# Patient Record
Sex: Male | Born: 1960 | Race: Black or African American | Hispanic: No | Marital: Married | Smoking: Never smoker
Health system: Southern US, Community
[De-identification: ages and names within clinical notes are randomized; demographics above are authoritative.]

## PROBLEM LIST (undated history)

## (undated) DIAGNOSIS — E119 Type 2 diabetes mellitus without complications: Secondary | ICD-10-CM

## (undated) DIAGNOSIS — C61 Malignant neoplasm of prostate: Secondary | ICD-10-CM

## (undated) HISTORY — DX: Type 2 diabetes mellitus without complications: E11.9

## (undated) MED FILL — Pegfilgrastim-fpgk Soln Prefilled Syringe 6 MG/0.6ML: SUBCUTANEOUS | Qty: 0.6 | Status: AC

---

## 1998-03-29 ENCOUNTER — Emergency Department (HOSPITAL_COMMUNITY): Admission: EM | Admit: 1998-03-29 | Discharge: 1998-03-29 | Payer: Self-pay | Admitting: Emergency Medicine

## 2006-04-14 ENCOUNTER — Emergency Department (HOSPITAL_COMMUNITY): Admission: EM | Admit: 2006-04-14 | Discharge: 2006-04-14 | Payer: Self-pay | Admitting: Emergency Medicine

## 2006-04-29 ENCOUNTER — Emergency Department (HOSPITAL_COMMUNITY): Admission: EM | Admit: 2006-04-29 | Discharge: 2006-04-29 | Payer: Self-pay | Admitting: Emergency Medicine

## 2008-12-11 ENCOUNTER — Emergency Department (HOSPITAL_COMMUNITY): Admission: EM | Admit: 2008-12-11 | Discharge: 2008-12-11 | Payer: Self-pay | Admitting: Family Medicine
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
## 2015-10-11 ENCOUNTER — Ambulatory Visit: Payer: Self-pay | Admitting: Podiatry

## 2016-08-11 ENCOUNTER — Encounter: Payer: Self-pay | Admitting: Physician Assistant

## 2016-08-11 ENCOUNTER — Encounter (HOSPITAL_COMMUNITY): Payer: Self-pay

## 2016-08-11 ENCOUNTER — Emergency Department (HOSPITAL_COMMUNITY)
Admission: EM | Admit: 2016-08-11 | Discharge: 2016-08-11 | Disposition: A | Discharge status: Home / Self Care | Payer: Self-pay | Attending: Emergency Medicine | Admitting: Emergency Medicine

## 2016-08-11 ENCOUNTER — Ambulatory Visit (INDEPENDENT_AMBULATORY_CARE_PROVIDER_SITE_OTHER): Payer: Self-pay | Admitting: Physician Assistant

## 2016-08-11 VITALS — BP 148/78 | HR 58 | Temp 97.6°F | Resp 58 | Ht 73.62 in | Wt 195.0 lb

## 2016-08-11 DIAGNOSIS — E119 Type 2 diabetes mellitus without complications: Secondary | ICD-10-CM

## 2016-08-11 DIAGNOSIS — E860 Dehydration: Secondary | ICD-10-CM | POA: Insufficient documentation

## 2016-08-11 DIAGNOSIS — R7989 Other specified abnormal findings of blood chemistry: Secondary | ICD-10-CM

## 2016-08-11 DIAGNOSIS — R81 Glycosuria: Secondary | ICD-10-CM

## 2016-08-11 DIAGNOSIS — E1165 Type 2 diabetes mellitus with hyperglycemia: Secondary | ICD-10-CM | POA: Insufficient documentation

## 2016-08-11 DIAGNOSIS — Z7984 Long term (current) use of oral hypoglycemic drugs: Secondary | ICD-10-CM | POA: Insufficient documentation

## 2016-08-11 DIAGNOSIS — R350 Frequency of micturition: Secondary | ICD-10-CM

## 2016-08-11 DIAGNOSIS — R739 Hyperglycemia, unspecified: Secondary | ICD-10-CM

## 2016-08-11 DIAGNOSIS — R634 Abnormal weight loss: Secondary | ICD-10-CM

## 2016-08-11 LAB — URINALYSIS, ROUTINE W REFLEX MICROSCOPIC
Bacteria, UA: NONE SEEN
Bilirubin Urine: NEGATIVE
HGB URINE DIPSTICK: NEGATIVE
KETONES UR: 5 mg/dL — AB
LEUKOCYTES UA: NEGATIVE
Nitrite: NEGATIVE
PROTEIN: NEGATIVE mg/dL
SQUAMOUS EPITHELIAL / LPF: NONE SEEN
Specific Gravity, Urine: 1.031 — ABNORMAL HIGH (ref 1.005–1.030)
pH: 5 (ref 5.0–8.0)

## 2016-08-11 LAB — POCT URINALYSIS DIP (MANUAL ENTRY)
BILIRUBIN UA: NEGATIVE
Blood, UA: NEGATIVE
LEUKOCYTES UA: NEGATIVE
NITRITE UA: NEGATIVE
Protein Ur, POC: NEGATIVE mg/dL
Spec Grav, UA: <=1.005 — AB (ref 1.010–1.025)
Urobilinogen, UA: 0.2 E.U./dL
pH, UA: 5 (ref 5.0–8.0)

## 2016-08-11 LAB — POCT CBC
Granulocyte percent: 60.5 %G (ref 37–80)
HEMATOCRIT: 43.1 % — AB (ref 43.5–53.7)
HEMOGLOBIN: 14.7 g/dL (ref 14.1–18.1)
LYMPH, POC: 1.5 (ref 0.6–3.4)
MCH, POC: 29.3 pg (ref 27–31.2)
MCHC: 34 g/dL (ref 31.8–35.4)
MCV: 86.1 fL (ref 80–97)
MID (CBC): 0.1 (ref 0–0.9)
MPV: 9.4 fL (ref 0–99.8)
POC GRANULOCYTE: 2.5 (ref 2–6.9)
POC LYMPH %: 36.8 % (ref 10–50)
POC MID %: 2.7 % (ref 0–12)
Platelet Count, POC: 163 10*3/uL (ref 142–424)
RBC: 5.01 M/uL (ref 4.69–6.13)
RDW, POC: 12.9 %
WBC: 4.1 10*3/uL — AB (ref 4.6–10.2)

## 2016-08-11 LAB — HEPATIC FUNCTION PANEL
ALT: 61 U/L (ref 17–63)
AST: 39 U/L (ref 15–41)
Albumin: 4 g/dL (ref 3.5–5.0)
Alkaline Phosphatase: 98 U/L (ref 38–126)
BILIRUBIN DIRECT: 0.1 mg/dL (ref 0.1–0.5)
BILIRUBIN TOTAL: 1 mg/dL (ref 0.3–1.2)
Indirect Bilirubin: 0.9 mg/dL (ref 0.3–0.9)
Total Protein: 7.4 g/dL (ref 6.5–8.1)

## 2016-08-11 LAB — POC MICROSCOPIC URINALYSIS (UMFC): MUCUS RE: ABSENT

## 2016-08-11 LAB — BASIC METABOLIC PANEL
ANION GAP: 12 (ref 5–15)
BUN: 22 mg/dL — ABNORMAL HIGH (ref 6–20)
CHLORIDE: 96 mmol/L — AB (ref 101–111)
CO2: 22 mmol/L (ref 22–32)
CREATININE: 1.3 mg/dL — AB (ref 0.61–1.24)
Calcium: 9.5 mg/dL (ref 8.9–10.3)
GFR calc non Af Amer: >60 mL/min (ref >60)
Glucose, Bld: 609 mg/dL (ref 65–99)
POTASSIUM: 4.3 mmol/L (ref 3.5–5.1)
SODIUM: 130 mmol/L — AB (ref 135–145)

## 2016-08-11 LAB — CBC
HCT: 42.5 % (ref 39.0–52.0)
HEMOGLOBIN: 14.8 g/dL (ref 13.0–17.0)
MCH: 29 pg (ref 26.0–34.0)
MCHC: 34.8 g/dL (ref 30.0–36.0)
MCV: 83.2 fL (ref 78.0–100.0)
PLATELETS: 188 10*3/uL (ref 150–400)
RBC: 5.11 MIL/uL (ref 4.22–5.81)
RDW: 12.5 % (ref 11.5–15.5)
WBC: 4.9 10*3/uL (ref 4.0–10.5)

## 2016-08-11 LAB — GLUCOSE, POCT (MANUAL RESULT ENTRY)

## 2016-08-11 LAB — CBG MONITORING, ED
GLUCOSE-CAPILLARY: 296 mg/dL — AB (ref 65–99)
Glucose-Capillary: 490 mg/dL — ABNORMAL HIGH (ref 65–99)

## 2016-08-11 LAB — POCT GLYCOSYLATED HEMOGLOBIN (HGB A1C): Hemoglobin A1C: 13.9

## 2016-08-11 MED ORDER — SODIUM CHLORIDE 0.9 % IV BOLUS (SEPSIS)
1000.0000 mL | Freq: Once | INTRAVENOUS | Status: AC
  Administered 2016-08-11: 1000 mL via INTRAVENOUS

## 2016-08-11 MED ORDER — METFORMIN HCL 500 MG PO TABS: 500.0000 mg | ORAL_TABLET | Freq: Two times a day (BID) | ORAL | 0 refills | Status: DC

## 2016-08-11 MED ORDER — BLOOD GLUCOSE MONITOR KIT: PACK | 0 refills | Status: DC

## 2016-08-11 MED ORDER — INSULIN ASPART 100 UNIT/ML ~~LOC~~ SOLN
10.0000 [IU] | Freq: Once | SUBCUTANEOUS | Status: AC
  Administered 2016-08-11: 10 [IU] via INTRAVENOUS
  Filled 2016-08-11: qty 1

## 2016-08-11 NOTE — Patient Instructions (Addendum)
Due to the fact that we cannot get a glucose reading on you and your concerning symptoms, you need to go directly to the ED for evaluation. You can go to either [facility] or [facility]. Our providers here are more than willing to take care of your diabetes management once you have been safely evaluated and treated at the ED. Thank you for letting me participate in your health and well being.    IF you received an x-ray today, you will receive an invoice from [facility]. Please contact [facility] at [phone] with questions or concerns regarding your invoice.   IF you received labwork today, you will receive an invoice from [facility]. Please contact [facility] at [phone] with questions or concerns regarding your invoice.   Our billing staff will not be able to assist you with questions regarding bills from these companies.  You will be contacted with the lab results as soon as they are available. The fastest way to get your results is to activate your My Chart account. Instructions are located on the last page of this paperwork. If you have not heard from us regarding the results in 2 weeks, please contact this office.

## 2016-08-11 NOTE — Progress Notes (Signed)
MRN: [id number]   Subjective:   Brandon [name] is a 56 y.o. male who presents for follow up on from a [facility] occupational health visit from earlier today. He went there for a work physical and he told the physician he was having weight loss, frequent urination, blurry vision, polydipsia, dry mouth,  x 3 weeks. They checked his sugar and told him it was high (he cannot remember the value) but he should come here for evaluation.   Pt has also had some  paresthesia of the feet x years. In terms of weight loss, he has lost 18 lbs in the past 2-3 weeks. Patient denies foot ulcerations, increased appetite, nausea and vomiting.    Diet consists of protein, carbs, fruits, vegetables and water. Takes 3 scoops of mass protein a day x years. They recently took it off the shelf but he does not know why. He does not know how much carbs/sugars each contains. He does not eat sweets. Exercise includes aerobics and weightlifting 4 times a week at the gym. Denies smoking or alcohol use.   He has no PMH. The last time he had blood work prior to this morning was years ago.  Has FH of T2DM in mother.    Objective:   PHYSICAL EXAM BP (!) 148/78   Pulse (!) 58   Temp 97.6 F (36.4 C) (Oral)   Resp (!) 58   Ht 6' 1.62" (1.87 m)   Wt 195 lb (88.5 kg)   SpO2 98%   BMI 25.29 kg/m   Physical Exam  Constitutional: He is oriented to person, place, and time. He appears well-developed and well-nourished. No distress.  HENT:  Head: Normocephalic and atraumatic.  Eyes: Conjunctivae are normal.  Neck: Normal range of motion.  Cardiovascular: Normal rate, regular rhythm, normal heart sounds and intact distal pulses.   Pulmonary/Chest: Effort normal and breath sounds normal.  Abdominal: Soft. Bowel sounds are normal. There is no tenderness.  Neurological: He is alert and oriented to person, place, and time.  Skin: Skin is warm and dry.  Psychiatric: He has a normal mood and affect.  Vitals  reviewed.   Results for orders placed or performed in visit on 08/11/16 (from the past 24 hour(s))  POCT urinalysis dipstick     Status: Abnormal   Collection Time: 08/11/16 11:52 AM  Result Value Ref Range   Color, UA yellow yellow   Clarity, UA clear clear   Glucose, UA =500 (A) negative mg/dL   Bilirubin, UA negative negative   Ketones, POC UA trace (5) (A) negative mg/dL   Spec Grav, UA <=4.098 (A) 1.010 - 1.025   Blood, UA negative negative   pH, UA 5.0 5.0 - 8.0   Protein Ur, POC negative negative mg/dL   Urobilinogen, UA 0.2 0.2 or 1.0 E.U./dL   Nitrite, UA Negative Negative   Leukocytes, UA Negative Negative  POCT CBC     Status: Abnormal   Collection Time: 08/11/16 11:57 AM  Result Value Ref Range   WBC 4.1 (A) 4.6 - 10.2 K/uL   Lymph, poc 1.5 0.6 - 3.4   POC LYMPH PERCENT 36.8 10 - 50 %L   MID (cbc) 0.1 0 - 0.9   POC MID % 2.7 0 - 12 %M   POC Granulocyte 2.5 2 - 6.9   Granulocyte percent 60.5 37 - 80 %G   RBC 5.01 4.69 - 6.13 M/uL   Hemoglobin 14.7 14.1 - 18.1 g/dL   HCT, POC 11.9 (  A) 43.5 - 53.7 %   MCV 86.1 80 - 97 fL   MCH, POC 29.3 27 - 31.2 pg   MCHC 34.0 31.8 - 35.4 g/dL   RDW, POC 82.912.9 %   Platelet Count, POC 163 142 - 424 K/uL   MPV 9.4 0 - 99.8 fL  POCT glucose (manual entry)     Status: None   Collection Time: 08/11/16 11:58 AM  Result Value Ref Range   POC Glucose HHH 70 - 99 mg/dl  POCT Microscopic Urinalysis ([facility])     Status: Abnormal   Collection Time: 08/11/16 12:03 PM  Result Value Ref Range   WBC,UR,HPF,POC None None WBC/hpf   RBC,UR,HPF,POC None None RBC/hpf   Bacteria None None, Too numerous to count   Mucus Absent Absent   Epithelial Cells, UR Per Microscopy Few (A) None, Too numerous to count cells/hpf  POCT glycosylated hemoglobin (Hb A1C)     Status: None   Collection Time: 08/11/16 12:03 PM  Result Value Ref Range   Hemoglobin A1C 13.9     Assessment and Plan :  1. Frequent urination - POCT urinalysis dipstick - POCT  Microscopic Urinalysis ([facility]) - POCT CBC - POCT glycosylated hemoglobin (Hb A1C) - POCT glucose (manual entry) 2. Loss of weight 3. Hyperglycemia 4. Glucosuria 5. Newly diagnosed diabetes (HCC) Due to pt's symptoms, glucosuria, ketonuria, and elevated glucose of unknown value in our clinic, pt warrants further emergent evaluation and treatment by the ED. Pt is stable and does therefore not warrant EMS transportation. He agrees to [facility]. His plan is to follow up here for management of T2DM>

## 2016-08-11 NOTE — ED Provider Notes (Signed)
[facility] DEPT Provider Note   CSN: [id number] Arrival date & time: 08/11/16  1239     History   Chief Complaint Chief Complaint  Patient presents with  . Hyperglycemia    HPI Brandon [name] is a 56 y.o. male.  Brandon [name] is a 56 y.o. Male who presents to the emergency department from his primary care doctor's office with concern for new onset diabetes. Patient reports over the past 3 weeks patient has had increased thirst and urination. He also reports intermittent episodes of blurry vision and tingling in his extremities. He denies these complaints currently. He reports being seen by his primary care doctor today who believes he has new onset diabetes. He was sent to the emergency department. They were unable to obtain a blood sugar on her glucometer. Patient reports a strong family history of diabetes. He tells me he is very active and this is upsetting that he has diabetes. He denies fevers, recent illness, dysuria, lightheadedness, dizziness, syncope, abdominal pain, nausea, vomiting, diarrhea, or rashes.   The history is provided by the patient and medical records. No language interpreter was used.  Hyperglycemia  Associated symptoms: increased thirst and polyuria   Associated symptoms: no abdominal pain, no chest pain, no dysuria, no fever, no nausea, no shortness of breath, no vomiting and no weakness     History reviewed. No pertinent past medical history.  There are no active problems to display for this patient.   History reviewed. No pertinent surgical history.     Home Medications    Prior to Admission medications   Medication Sig Start Date End Date Taking? Authorizing Provider  blood glucose meter kit and supplies KIT Dispense based on patient and insurance preference. Use up to four times daily as directed. (FOR ICD-9 250.00, 250.01). 08/11/16   Waynetta Pean, PA-C  metFORMIN (GLUCOPHAGE) 500 MG tablet Take 1 tablet (500 mg total) by mouth 2 (two)  times daily. 08/11/16   Waynetta Pean, PA-C    Family History Family History  Problem Relation Age of Onset  . Diabetes Mother   . Diabetes Maternal Grandmother     Social History Social History  Substance Use Topics  . Smoking status: Never Smoker  . Smokeless tobacco: Never Used  . Alcohol use No     Allergies   Patient has no known allergies.   Review of Systems Review of Systems  Constitutional: Negative for chills and fever.  HENT: Negative for congestion and sore throat.   Eyes: Positive for visual disturbance (intermittent ).  Respiratory: Negative for cough and shortness of breath.   Cardiovascular: Negative for chest pain.  Gastrointestinal: Negative for abdominal pain, diarrhea, nausea and vomiting.  Endocrine: Positive for polydipsia and polyuria.  Genitourinary: Positive for frequency. Negative for difficulty urinating, dysuria and urgency.  Musculoskeletal: Negative for back pain and neck pain.  Skin: Negative for rash.  Neurological: Negative for syncope, weakness, light-headedness and headaches.     Physical Exam Updated Vital Signs BP 113/84   Pulse 61   Temp 97.2 F (36.2 C) (Oral)   Resp 16   SpO2 100%   Physical Exam  Constitutional: He is oriented to person, place, and time. He appears well-developed and well-nourished. No distress.  Nontoxic appearing.  HENT:  Head: Normocephalic and atraumatic.  Mouth/Throat: Oropharynx is clear and moist.  Mucous membranes are moist.  Eyes: Conjunctivae are normal. Pupils are equal, round, and reactive to light. Right eye exhibits no discharge. Left eye exhibits no discharge.  Neck: Normal range of motion. Neck supple. No JVD present.  Cardiovascular: Normal rate, regular rhythm, normal heart sounds and intact distal pulses.  Exam reveals no gallop and no friction rub.   No murmur heard. Pulmonary/Chest: Effort normal and breath sounds normal. No stridor. No respiratory distress. He has no wheezes. He  has no rales.  Abdominal: Soft. There is no tenderness. There is no guarding.  Abdomen is soft and nontender to palpation.  Musculoskeletal: He exhibits no edema or tenderness.  Lymphadenopathy:    He has no cervical adenopathy.  Neurological: He is alert and oriented to person, place, and time. No sensory deficit. Coordination normal.  Skin: Skin is warm and dry. Capillary refill takes less than 2 seconds. No rash noted. He is not diaphoretic. No erythema. No pallor.  Psychiatric: He has a normal mood and affect. His behavior is normal.  Nursing note and vitals reviewed.    ED Treatments / Results  Labs (all labs ordered are listed, but only abnormal results are displayed) Labs Reviewed  BASIC METABOLIC PANEL - Abnormal; Notable for the following:       Result Value   Sodium 130 (*)    Chloride 96 (*)    Glucose, Bld 609 (*)    BUN 22 (*)    Creatinine, Ser 1.30 (*)    All other components within normal limits  URINALYSIS, ROUTINE W REFLEX MICROSCOPIC - Abnormal; Notable for the following:    Color, Urine STRAW (*)    Specific Gravity, Urine 1.031 (*)    Glucose, UA >=500 (*)    Ketones, ur 5 (*)    All other components within normal limits  CBG MONITORING, ED - Abnormal; Notable for the following:    Glucose-Capillary 490 (*)    All other components within normal limits  CBG MONITORING, ED - Abnormal; Notable for the following:    Glucose-Capillary 296 (*)    All other components within normal limits  CBC  HEPATIC FUNCTION PANEL    EKG  EKG Interpretation None       Radiology No results found.  Procedures Procedures (including critical care time)  Medications Ordered in ED Medications  sodium chloride 0.9 % bolus 1,000 mL (0 mLs Intravenous Stopped 08/11/16 1456)  insulin aspart (novoLOG) injection 10 Units (10 Units Intravenous Given 08/11/16 1455)  sodium chloride 0.9 % bolus 1,000 mL (0 mLs Intravenous Stopped 08/11/16 1606)     Initial Impression /  Assessment and Plan / ED Course  I have reviewed the triage vital signs and the nursing notes.  Pertinent labs & imaging results that were available during my care of the patient were reviewed by me and considered in my medical decision making (see chart for details).    This  is a 56 y.o. Male who presents to the emergency department from his primary care doctor's office with concern for new onset diabetes. Patient reports over the past 3 weeks patient has had increased thirst and urination. He also reports intermittent episodes of blurry vision and tingling in his extremities. He denies these complaints currently. He reports being seen by his primary care doctor today who believes he has new onset diabetes. He was sent to the emergency department. They were unable to obtain a blood sugar on her glucometer. Patient reports a strong family history of diabetes. On examination is afebrile nontoxic appearing. Blood sugar here shows a glucose of 609. Creatinine is elevated at 1.30. No baseline to compare. We'll have him have  this rechecked by primary care. He has a normal anion gap. Urinalysis shows glucosuria.  Patient received a 2 L fluid bolus and 10 units of insulin. At recheck patient's blood sugars is 296. Will discharge at this time and start the patient on metformin 500 mg twice a day. We'll have him closely follow-up with his primary care provider next week. I discussed the expected side effects with metformin. I also provided him with a glucometer prescription and discussed checking his blood sugars. I discussed return precautions. I advised the patient to follow-up with their primary care provider this week. I advised the patient to return to the emergency department with new or worsening symptoms or new concerns. The patient verbalized understanding and agreement with plan.    This patient was discussed with Dr. Lita Mains who agrees with assessment and plan.   Final Clinical Impressions(s) / ED  Diagnoses   Final diagnoses:  New onset type 2 diabetes mellitus (HCC)  Hyperglycemia  Dehydration  Elevated serum creatinine    New Prescriptions New Prescriptions   BLOOD GLUCOSE METER KIT AND SUPPLIES KIT    Dispense based on patient and insurance preference. Use up to four times daily as directed. (FOR ICD-9 250.00, 250.01).   METFORMIN (GLUCOPHAGE) 500 MG TABLET    Take 1 tablet (500 mg total) by mouth 2 (two) times daily.     Waynetta Pean, PA-C 08/11/16 1620    Julianne Rice, MD 08/18/16 [phone]

## 2016-08-11 NOTE — ED Notes (Signed)
Pt ambulated to room. Giving urine sample at this time.

## 2016-08-11 NOTE — ED Triage Notes (Signed)
Pt states he was at his PCP office and was sent here for possible DM. He had CBG that read high and has had increased thirst and urination X3 weeks.

## 2016-08-12 LAB — CMP14+EGFR
A/G RATIO: 1.6 (ref 1.2–2.2)
ALT: 57 IU/L — ABNORMAL HIGH (ref 0–44)
AST: 37 IU/L (ref 0–40)
Albumin: 4.2 g/dL (ref 3.5–5.5)
Alkaline Phosphatase: 108 IU/L (ref 39–117)
BUN/Creatinine Ratio: 17 (ref 9–20)
BUN: 22 mg/dL (ref 6–24)
Bilirubin Total: 0.6 mg/dL (ref 0.0–1.2)
CALCIUM: 9.2 mg/dL (ref 8.7–10.2)
CO2: 22 mmol/L (ref 18–29)
Chloride: 91 mmol/L — ABNORMAL LOW (ref 96–106)
Creatinine, Ser: 1.33 mg/dL — ABNORMAL HIGH (ref 0.76–1.27)
GFR, EST AFRICAN AMERICAN: 70 mL/min/{1.73_m2} (ref >59)
GFR, EST NON AFRICAN AMERICAN: 60 mL/min/{1.73_m2} (ref >59)
GLOBULIN, TOTAL: 2.7 g/dL (ref 1.5–4.5)
GLUCOSE: 681 mg/dL — AB (ref 65–99)
POTASSIUM: 5 mmol/L (ref 3.5–5.2)
SODIUM: 129 mmol/L — AB (ref 134–144)
Total Protein: 6.9 g/dL (ref 6.0–8.5)

## 2016-08-12 LAB — TSH: TSH: 1.28 u[IU]/mL (ref 0.450–4.500)

## 2016-08-14 ENCOUNTER — Telehealth: Payer: Self-pay | Admitting: Physician Assistant

## 2016-08-14 NOTE — Telephone Encounter (Signed)
Pt is [facility]  Best number [phone]

## 2016-08-14 NOTE — Telephone Encounter (Signed)
See very abnormal results and advise

## 2016-08-14 NOTE — Telephone Encounter (Signed)
Called pt, he was not home but his wife answered. I discussed that all labs we ordered for that day were discussed and printed on his AVS. He will need to follow up for chronic manage of diabetes.

## 2016-08-17 ENCOUNTER — Ambulatory Visit: Payer: Self-pay | Admitting: Physician Assistant

## 2016-08-22 ENCOUNTER — Emergency Department (HOSPITAL_COMMUNITY)
Admission: EM | Admit: 2016-08-22 | Discharge: 2016-08-22 | Disposition: A | Discharge status: Home / Self Care | Payer: Self-pay | Attending: Emergency Medicine | Admitting: Emergency Medicine

## 2016-08-22 ENCOUNTER — Ambulatory Visit (INDEPENDENT_AMBULATORY_CARE_PROVIDER_SITE_OTHER): Payer: Self-pay | Admitting: Physician Assistant

## 2016-08-22 ENCOUNTER — Encounter: Payer: Self-pay | Admitting: Physician Assistant

## 2016-08-22 ENCOUNTER — Encounter (HOSPITAL_COMMUNITY): Payer: Self-pay

## 2016-08-22 VITALS — BP 143/81 | HR 74 | Temp 98.1°F | Resp 18 | Ht 73.43 in | Wt 190.6 lb

## 2016-08-22 DIAGNOSIS — R739 Hyperglycemia, unspecified: Secondary | ICD-10-CM

## 2016-08-22 DIAGNOSIS — E119 Type 2 diabetes mellitus without complications: Secondary | ICD-10-CM

## 2016-08-22 DIAGNOSIS — E1165 Type 2 diabetes mellitus with hyperglycemia: Secondary | ICD-10-CM | POA: Insufficient documentation

## 2016-08-22 DIAGNOSIS — R824 Acetonuria: Secondary | ICD-10-CM

## 2016-08-22 DIAGNOSIS — Z7984 Long term (current) use of oral hypoglycemic drugs: Secondary | ICD-10-CM | POA: Insufficient documentation

## 2016-08-22 DIAGNOSIS — Z79899 Other long term (current) drug therapy: Secondary | ICD-10-CM | POA: Insufficient documentation

## 2016-08-22 DIAGNOSIS — R81 Glycosuria: Secondary | ICD-10-CM

## 2016-08-22 LAB — AMYLASE: AMYLASE: 35 U/L (ref 28–100)

## 2016-08-22 LAB — CBG MONITORING, ED
GLUCOSE-CAPILLARY: 229 mg/dL — AB (ref 65–99)
Glucose-Capillary: 454 mg/dL — ABNORMAL HIGH (ref 65–99)

## 2016-08-22 LAB — GLUCOSE, POCT (MANUAL RESULT ENTRY)

## 2016-08-22 LAB — URINALYSIS, ROUTINE W REFLEX MICROSCOPIC
BACTERIA UA: NONE SEEN
BILIRUBIN URINE: NEGATIVE
Glucose, UA: >=500 mg/dL — AB
HGB URINE DIPSTICK: NEGATIVE
KETONES UR: 20 mg/dL — AB
LEUKOCYTES UA: NEGATIVE
NITRITE: NEGATIVE
PH: 5 (ref 5.0–8.0)
Protein, ur: NEGATIVE mg/dL
Specific Gravity, Urine: 1.035 — ABNORMAL HIGH (ref 1.005–1.030)
Squamous Epithelial / LPF: NONE SEEN

## 2016-08-22 LAB — HEPATIC FUNCTION PANEL
ALBUMIN: 4 g/dL (ref 3.5–5.0)
ALT: 59 U/L (ref 17–63)
AST: 30 U/L (ref 15–41)
Alkaline Phosphatase: 77 U/L (ref 38–126)
BILIRUBIN TOTAL: 0.7 mg/dL (ref 0.3–1.2)
Bilirubin, Direct: 0.1 mg/dL (ref 0.1–0.5)
Indirect Bilirubin: 0.6 mg/dL (ref 0.3–0.9)
TOTAL PROTEIN: 6.9 g/dL (ref 6.5–8.1)

## 2016-08-22 LAB — CBC
HEMATOCRIT: 40.9 % (ref 39.0–52.0)
HEMOGLOBIN: 14 g/dL (ref 13.0–17.0)
MCH: 28.3 pg (ref 26.0–34.0)
MCHC: 34.2 g/dL (ref 30.0–36.0)
MCV: 82.8 fL (ref 78.0–100.0)
Platelets: 188 10*3/uL (ref 150–400)
RBC: 4.94 MIL/uL (ref 4.22–5.81)
RDW: 12.7 % (ref 11.5–15.5)
WBC: 4.5 10*3/uL (ref 4.0–10.5)

## 2016-08-22 LAB — BASIC METABOLIC PANEL
ANION GAP: 9 (ref 5–15)
BUN: 20 mg/dL (ref 6–20)
CO2: 23 mmol/L (ref 22–32)
Calcium: 9.4 mg/dL (ref 8.9–10.3)
Chloride: 101 mmol/L (ref 101–111)
Creatinine, Ser: 1.07 mg/dL (ref 0.61–1.24)
GFR calc Af Amer: >60 mL/min (ref >60)
Glucose, Bld: 451 mg/dL — ABNORMAL HIGH (ref 65–99)
POTASSIUM: 4.3 mmol/L (ref 3.5–5.1)
SODIUM: 133 mmol/L — AB (ref 135–145)

## 2016-08-22 LAB — POCT URINALYSIS DIP (MANUAL ENTRY)
Bilirubin, UA: NEGATIVE
Blood, UA: NEGATIVE
Glucose, UA: 500 mg/dL — AB
LEUKOCYTES UA: NEGATIVE
Nitrite, UA: NEGATIVE
PROTEIN UA: NEGATIVE mg/dL
Spec Grav, UA: <=1.005 — AB (ref 1.010–1.025)
UROBILINOGEN UA: 0.2 U/dL
pH, UA: 5.5 (ref 5.0–8.0)

## 2016-08-22 LAB — LIPASE, BLOOD: Lipase: 21 U/L (ref 11–51)

## 2016-08-22 MED ORDER — SODIUM CHLORIDE 0.9 % IV BOLUS (SEPSIS)
1000.0000 mL | Freq: Once | INTRAVENOUS | Status: AC
  Administered 2016-08-22: 1000 mL via INTRAVENOUS

## 2016-08-22 NOTE — ED Provider Notes (Signed)
Patient signed out at end of shift by Brandon RutherfordShawn Joy, PA-C. New diabetic, diagnosed last week. On BID metformin. Saw his PCP for first time today and CBG found to be elevated so sent here for treatment. Here, hyperglycemia 450. No other complaints. Getting fluids. Will need recheck CBG. Anticipate discharge home.  Recheck CBG after fluids is 227. He is stable for discharge home. He has a follow up appointment with his doctor in the morning.    Elpidio AnisUpstill, Lexee Brashears, PA-C 08/22/16 2146    Tilden Fossaees, Elizabeth, MD 08/23/16 [phone]

## 2016-08-22 NOTE — Patient Instructions (Addendum)
You need to go immediately to the ED for further evaluation.  I need to see you the day after you are discharged so we can start you on the appropriate dose of medication.   IF you received an x-ray today, you will receive an invoice from [facility]. Please contact [facility] at [phone] with questions or concerns regarding your invoice.   IF you received labwork today, you will receive an invoice from [facility]. Please contact [facility] at [phone] with questions or concerns regarding your invoice.   Our billing staff will not be able to assist you with questions regarding bills from these companies.  You will be contacted with the lab results as soon as they are available. The fastest way to get your results is to activate your My Chart account. Instructions are located on the last page of this paperwork. If you have not heard from us regarding the results in 2 weeks, please contact this office.

## 2016-08-22 NOTE — ED Provider Notes (Signed)
[facility] DEPT Provider Note   CSN: [id number] Arrival date & time: 08/22/16  1331     History   Chief Complaint Chief Complaint  Patient presents with  . Hyperglycemia    HPI Brandon [name] is a 56 y.o. male.  HPI   Brandon [name] is a 56 y.o. male, with a history of possible DM, presenting to the ED with hyperglycemia.   Patient states he was seen on May 11 for hyperglycemia and diagnosed with new onset diabetes. Started on Metformin and told to follow up a PCP.  States he established care with [facility] today, saw [place], PA-C, found to have a glucose of over 500. Patient states, "I was told to come to the ED to find out if I need insulin and was told they would see me again tomorrow morning." Endorses polyuria and polydipsia. Also endorses unintended weight loss of 24lbs over the last month.  Denies SOB, CP, abdominal pain, N/V/D, fever/chills, or any other complaints.   Patient is not obese, eats well, and works out everyday. He does have an extensive family history of diabetes, however.    Past Medical History:  Diagnosis Date  . Diabetes mellitus without complication ([place])     There are no active problems to display for this patient.   History reviewed. No pertinent surgical history.     Home Medications    Prior to Admission medications   Medication Sig Start Date End Date Taking? Authorizing Provider  blood glucose meter kit and supplies KIT Dispense based on patient and insurance preference. Use up to four times daily as directed. (FOR ICD-9 250.00, 250.01). 08/11/16  Yes Waynetta Pean, PA-C  metFORMIN (GLUCOPHAGE) 500 MG tablet Take 1 tablet (500 mg total) by mouth 2 (two) times daily. 08/11/16  Yes Waynetta Pean, PA-C  Multiple Vitamins-Minerals (MULTIVITAMIN WITH MINERALS) tablet Take 1 tablet by mouth daily.   Yes [provider]    Family History Family History  Problem Relation Age of Onset  . Diabetes Mother    . Diabetes Maternal Grandmother     Social History Social History  Substance Use Topics  . Smoking status: Never Smoker  . Smokeless tobacco: Never Used  . Alcohol use No     Allergies   Patient has no known allergies.   Review of Systems Review of Systems  Constitutional: Positive for unexpected weight change. Negative for chills, diaphoresis and fever.  Respiratory: Negative for shortness of breath.   Cardiovascular: Negative for chest pain.  Gastrointestinal: Negative for abdominal pain, nausea and vomiting.  Endocrine: Positive for polydipsia and polyuria.  All other systems reviewed and are negative.    Physical Exam Updated Vital Signs BP 124/83 (BP Location: Right Arm)   Pulse 67   Temp 98.1 F (36.7 C) (Oral)   Resp 16   SpO2 99%   Physical Exam  Constitutional: He appears well-developed and well-nourished. No distress.  HENT:  Head: Normocephalic and atraumatic.  Eyes: Conjunctivae are normal.  Neck: Neck supple.  Cardiovascular: Normal rate, regular rhythm, normal heart sounds and intact distal pulses.   Pulmonary/Chest: Effort normal and breath sounds normal. No respiratory distress.  Abdominal: Soft. There is no tenderness. There is no guarding.  Musculoskeletal: He exhibits no edema.  Lymphadenopathy:    He has no cervical adenopathy.  Neurological: He is alert.  Skin: Skin is warm and dry. He is not diaphoretic.  Psychiatric: He has a normal mood and affect. His behavior is normal.  Nursing note and vitals reviewed.    ED Treatments / Results  Labs (all labs ordered are listed, but only abnormal results are displayed) Labs Reviewed  BASIC METABOLIC PANEL - Abnormal; Notable for the following:       Result Value   Sodium 133 (*)    Glucose, Bld 451 (*)    All other components within normal limits  URINALYSIS, ROUTINE W REFLEX MICROSCOPIC - Abnormal; Notable for the following:    Specific Gravity, Urine 1.035 (*)    Glucose, UA >=500 (*)     Ketones, ur 20 (*)    All other components within normal limits  CBG MONITORING, ED - Abnormal; Notable for the following:    Glucose-Capillary 454 (*)    All other components within normal limits  CBC  BLOOD GAS, VENOUS  LIPASE, BLOOD  HEPATIC FUNCTION PANEL  AMYLASE    EKG  EKG Interpretation None       Radiology No results found.  Procedures Procedures (including critical care time)  Medications Ordered in ED Medications  sodium chloride 0.9 % bolus 1,000 mL (1,000 mLs Intravenous New Bag/Given 08/22/16 2003)    Followed by  sodium chloride 0.9 % bolus 1,000 mL (1,000 mLs Intravenous New Bag/Given 08/22/16 2032)     Initial Impression / Assessment and Plan / ED Course  I have reviewed the triage vital signs and the nursing notes.  Pertinent labs & imaging results that were available during my care of the patient were reviewed by me and considered in my medical decision making (see chart for details).     Patient presents with hyperglycemia. Low suspicion for DKA. No increased anion gap. Currently no indication for insulin therapy.  End of shift patient care handoff report given to Charlann Lange, PA-C. Plan: IV fluids. Oral fluid challenge. Patient has close follow up with his PCP tomorrow morning.   Findings and plan of care discussed with Hazle Coca, MD.   Vitals:   08/22/16 1404 08/22/16 1904 08/22/16 2004 08/22/16 2005  BP: 124/83 109/80  134/71  Pulse: 67 (!) 57  63  Resp: '16 16  16  ' Temp: 98.1 F (36.7 C)   98 F (36.7 C)  TempSrc: Oral   Oral  SpO2: 99% 96%  95%  Weight:   86.2 kg (190 lb)   Height:   '6\' 2"'  (1.88 m)       Final Clinical Impressions(s) / ED Diagnoses   Final diagnoses:  Hyperglycemia    New Prescriptions New Prescriptions   No medications on file     Brandon [name] 08/22/16 2035    Lorayne Bender, PA-C 08/22/16 2035    Quintella Reichert, MD 08/23/16 [phone]

## 2016-08-22 NOTE — ED Triage Notes (Signed)
Pt states he was sent here by his PCP for hyperglycemia. Recently seen here for new onset diabetes but BS is high again. resp e/u. No distress noted.

## 2016-08-22 NOTE — Progress Notes (Signed)
MRN: [id number]  Subjective:   Brandon [name] is a 56 y.o. male who presents for follow up on newly diagnosed type 2 diabetes mellitus. Pt initially seen by me in office on 08/11/16 after being seen at [facility] for an employment physical and being told his sugar was very elevated so he needed to find a PCP. During the visit on 08/11/16, his POCT glucose read as [facility] and he had trace ketones and large amount of glucose in his urine. He was having weight loss, frequent urination, blurry vision, polydipsia, dry mouth x 3 weeks. Had lost about 15 lbs at that point. Due to his symptoms and POCT labs, he was sent to ED for further emergent evaluation. Per ED notes, pt's sugar was 609, creatinine of 1.30, normal anion gap. He was given 2 L fluid bolus and 10 units of insulin. Pt's recheck blood sugars was 296. He was discharged at on metformin 500 mg twice a day and told to follow up with primary care provider next week.  Today, pt notes he is not feeling much better. He is still having dry mouth, urinary frequency, polydipsia, and weight loss. His blurred vision has improved. He notes his weakness is actually worse than last time Patient denies abdominal pain, nausea, visual disturbances and vomiting. He has been taking his metformin as prescribed and is tolerating the medication well. Has not changed much in his diet. He notes he does eat a good amount of carbs but avoids most sweets. Does consume sweet tea and juices occasionally throughout the week. Has FH of diabetes in mother.    Objective:   PHYSICAL EXAM BP (!) 143/81 (BP Location: Right Arm, Patient Position: Sitting, Cuff Size: Small)   Pulse 74   Temp 98.1 F (36.7 C) (Oral)   Resp 18   Ht 6' 1.43" (1.865 m)   Wt 190 lb 9.6 oz (86.5 kg)   SpO2 98%   BMI 24.86 kg/m   Physical Exam  Constitutional: He is oriented to person, place, and time. He appears well-developed. No distress.  Appears malnourished  HENT:  Head:  Normocephalic and atraumatic.  Mouth/Throat: Uvula is midline. Mucous membranes are dry (mildly).  Eyes: Conjunctivae are normal.  Neck: Normal range of motion.  Cardiovascular: Normal rate, regular rhythm and normal heart sounds.   Pulmonary/Chest: Effort normal and breath sounds normal.  Abdominal: Soft. Normal appearance and bowel sounds are normal. There is no tenderness.  Neurological: He is alert and oriented to person, place, and time.  Skin: Skin is warm and dry. He is not diaphoretic. There is pallor (mildly).  Psychiatric: He has a normal mood and affect.  Vitals reviewed.    Results for orders placed or performed in visit on 08/22/16 (from the past 24 hour(s))  POCT urinalysis dipstick     Status: Abnormal   Collection Time: 08/22/16 12:18 PM  Result Value Ref Range   Color, UA yellow yellow   Clarity, UA clear clear   Glucose, UA =500 (A) negative mg/dL   Bilirubin, UA negative negative   Ketones, POC UA small (15) (A) negative mg/dL   Spec Grav, UA <=1.191<=1.005 (A) 1.010 - 1.025   Blood, UA negative negative   pH, UA 5.5 5.0 - 8.0   Protein Ur, POC negative negative mg/dL   Urobilinogen, UA 0.2 0.2 or 1.0 E.U./dL   Nitrite, UA Negative Negative   Leukocytes, UA Negative Negative  POCT glucose (manual entry)     Status: None  Collection Time: 08/22/16 12:19 PM  Result Value Ref Range   POC Glucose HHH 70 - 99 mg/dl   Wt Readings from Last 3 Encounters:  08/22/16 190 lb 9.6 oz (86.5 kg)  08/11/16 195 lb (88.5 kg)    Assessment and Plan :  This case was precpeted with Dr. Neva Seat.   1. Newly diagnosed diabetes (HCC) Due to pt's continuing symptoms, glucosuria, worsening ketonuria, and elevated glucose of unknown value in our clinic, pt warrants further emergent evaluation and treatment by the ED. Pt is stable and does therefore not warrant EMS transportation. He agrees to go immediately via personal vehicle to [facility] ED. Encouraged him to follow up with me in  office the day after he is discharged from the hospital so we can initiate an appropriate dose of diabetes medication to gain better control of his diabetes.Encouraged him to go ahead and increase metformin dose to1000mg  BID, will likely need to add insulin to his regimen at follow up visit.  - POCT glucose (manual entry) - POCT urinalysis dipstick - Ambulatory referral to diabetic education 2. Hyperglycemia 3. Glucosuria 4. Ketonuria  Benjiman Core, PA-C  Primary Care at [facility] 08/22/2016 2:46 PM

## 2016-08-23 ENCOUNTER — Ambulatory Visit (INDEPENDENT_AMBULATORY_CARE_PROVIDER_SITE_OTHER): Payer: Self-pay | Admitting: Physician Assistant

## 2016-08-23 ENCOUNTER — Encounter: Payer: Self-pay | Admitting: Physician Assistant

## 2016-08-23 VITALS — BP 124/74 | HR 77 | Temp 98.0°F | Resp 18 | Ht 74.0 in | Wt 194.4 lb

## 2016-08-23 DIAGNOSIS — E119 Type 2 diabetes mellitus without complications: Secondary | ICD-10-CM

## 2016-08-23 MED ORDER — GLIMEPIRIDE 2 MG PO TABS: 2.0000 mg | ORAL_TABLET | Freq: Every day | ORAL | 0 refills | Status: DC

## 2016-08-23 MED ORDER — METFORMIN HCL 1000 MG PO TABS: 1000.0000 mg | ORAL_TABLET | Freq: Two times a day (BID) | ORAL | 0 refills | Status: DC

## 2016-08-23 NOTE — Patient Instructions (Addendum)
You can call the drug companies for the following agents and ask if there is a program that will help with finances for eithe rof these medications: Farxiga or Jardiance of Januvia   Start taking glimiperide 2 mg daily in morning with first meal. Keep taking metformin 1000mg  twice a day. Start checking your sugars twice a day (once first thing in the morning and once after a meal). Goal first thing in the morning is 80-140. If you go lower than 80, please stop taking glimerperide. If your sugars are staying near the 400s over the next week, make an appointment to see me within a week. If they are staying more in the 200 range then see me in 2 weeks. I will put in a referral to endocrinology so they can follow you for this disorder. Also, please document what you are eating for breakfast, lunch and dinner over the next two weeks. I have given you info below on carb counting and signs of hypotension. I have also given a refill for metformin 1000mg  twice a day. Thank you for letting me participate in your health and well being.      Carbohydrate Counting for Diabetes Mellitus, Adult Carbohydrate counting is a method for keeping track of how many carbohydrates you eat. Eating carbohydrates naturally increases the amount of sugar (glucose) in the blood. Counting how many carbohydrates you eat helps keep your blood glucose within normal limits, which helps you manage your diabetes (diabetes mellitus). It is important to know how many carbohydrates you can safely have in each meal. This is different for every person. A diet and nutrition specialist (registered dietitian) can help you make a meal plan and calculate how many carbohydrates you should have at each meal and snack. Carbohydrates are found in the following foods:  Grains, such as breads and cereals.  Dried beans and soy products.  Starchy vegetables, such as potatoes, peas, and corn.  Fruit and fruit juices.  Milk and yogurt.  Sweets and  snack foods, such as cake, cookies, candy, chips, and soft drinks. How do I count carbohydrates? There are two ways to count carbohydrates in food. You can use either of the methods or a combination of both. Reading "Nutrition Facts" on packaged food  The "Nutrition Facts" list is included on the labels of almost all packaged foods and beverages in the [place] It includes:  The serving size.  Information about nutrients in each serving, including the grams (g) of carbohydrate per serving. To use the "Nutrition Facts":  Decide how many servings you will have.  Multiply the number of servings by the number of carbohydrates per serving.  The resulting number is the total amount of carbohydrates that you will be having. Learning standard serving sizes of other foods  When you eat foods containing carbohydrates that are not packaged or do not include "Nutrition Facts" on the label, you need to measure the servings in order to count the amount of carbohydrates:  Measure the foods that you will eat with a food scale or measuring cup, if needed.  Decide how many standard-size servings you will eat.  Multiply the number of servings by 15. Most carbohydrate-rich foods have about 15 g of carbohydrates per serving.  For example, if you eat 8 oz (170 g) of strawberries, you will have eaten 2 servings and 30 g of carbohydrates (2 servings x 15 g = 30 g).  For foods that have more than one food mixed, such as soups and casseroles,  you must count the carbohydrates in each food that is included. The following list contains standard serving sizes of common carbohydrate-rich foods. Each of these servings has about 15 g of carbohydrates:   hamburger bun or  English muffin.   oz (15 mL) syrup.   oz (14 g) jelly.  1 slice of bread.  1 six-inch tortilla.  3 oz (85 g) cooked rice or pasta.  4 oz (113 g) cooked dried beans.  4 oz (113 g) starchy vegetable, such as peas, corn, or potatoes.  4 oz  (113 g) hot cereal.  4 oz (113 g) mashed potatoes or  of a large baked potato.  4 oz (113 g) canned or frozen fruit.  4 oz (120 mL) fruit juice.  4-6 crackers.  6 chicken nuggets.  6 oz (170 g) unsweetened dry cereal.  6 oz (170 g) plain fat-free yogurt or yogurt sweetened with artificial sweeteners.  8 oz (240 mL) milk.  8 oz (170 g) fresh fruit or one small piece of fruit.  24 oz (680 g) popped popcorn. Example of carbohydrate counting Sample meal   3 oz (85 g) chicken breast.  6 oz (170 g) brown rice.  4 oz (113 g) corn.  8 oz (240 mL) milk.  8 oz (170 g) strawberries with sugar-free whipped topping. Carbohydrate calculation  1. Identify the foods that contain carbohydrates:  Rice.  Corn.  Milk.  Strawberries. 2. Calculate how many servings you have of each food:  2 servings rice.  1 serving corn.  1 serving milk.  1 serving strawberries. 3. Multiply each number of servings by 15 g:  2 servings rice x 15 g = 30 g.  1 serving corn x 15 g = 15 g.  1 serving milk x 15 g = 15 g.  1 serving strawberries x 15 g = 15 g. 4. Add together all of the amounts to find the total grams of carbohydrates eaten:  30 g + 15 g + 15 g + 15 g = 75 g of carbohydrates total. This information is not intended to replace advice given to you by your health care provider. Make sure you discuss any questions you have with your health care provider. Document Released: 03/20/2005 Document Revised: 10/08/2015 Document Reviewed: 09/01/2015 Elsevier Interactive Patient Education  2017 Elsevier Inc.    Diabetes Mellitus and Food It is important for you to manage your blood sugar (glucose) level. Your blood glucose level can be greatly affected by what you eat. Eating healthier foods in the appropriate amounts throughout the day at about the same time each day will help you control your blood glucose level. It can also help slow or prevent worsening of your diabetes mellitus.  Healthy eating may even help you improve the level of your blood pressure and reach or maintain a healthy weight. General recommendations for healthful eating and cooking habits include:  Eating meals and snacks regularly. Avoid going long periods of time without eating to lose weight.  Eating a diet that consists mainly of plant-based foods, such as fruits, vegetables, nuts, legumes, and whole grains.  Using low-heat cooking methods, such as baking, instead of high-heat cooking methods, such as deep frying. Work with your dietitian to make sure you understand how to use the Nutrition Facts information on food labels. How can food affect me? Carbohydrates  Carbohydrates affect your blood glucose level more than any other type of food. Your dietitian will help you determine how many carbohydrates to eat  at each meal and teach you how to count carbohydrates. Counting carbohydrates is important to keep your blood glucose at a healthy level, especially if you are using insulin or taking certain medicines for diabetes mellitus. Alcohol  Alcohol can cause sudden decreases in blood glucose (hypoglycemia), especially if you use insulin or take certain medicines for diabetes mellitus. Hypoglycemia can be a life-threatening condition. Symptoms of hypoglycemia (sleepiness, dizziness, and disorientation) are similar to symptoms of having too much alcohol. If your health care provider has given you approval to drink alcohol, do so in moderation and use the following guidelines:  Women should not have more than one drink per day, and men should not have more than two drinks per day. One drink is equal to:  12 oz of beer.  5 oz of wine.  1 oz of hard liquor.  Do not drink on an empty stomach.  Keep yourself hydrated. Have water, diet soda, or unsweetened iced tea.  Regular soda, juice, and other mixers might contain a lot of carbohydrates and should be counted. What foods are not recommended? As you  make food choices, it is important to remember that all foods are not the same. Some foods have fewer nutrients per serving than other foods, even though they might have the same number of calories or carbohydrates. It is difficult to get your body what it needs when you eat foods with fewer nutrients. Examples of foods that you should avoid that are high in calories and carbohydrates but low in nutrients include:  Trans fats (most processed foods list trans fats on the Nutrition Facts label).  Regular soda.  Juice.  Candy.  Sweets, such as cake, pie, doughnuts, and cookies.  Fried foods. What foods can I eat? Eat nutrient-rich foods, which will nourish your body and keep you healthy. The food you should eat also will depend on several factors, including:  The calories you need.  The medicines you take.  Your weight.  Your blood glucose level.  Your blood pressure level.  Your cholesterol level. You should eat a variety of foods, including:  Protein.  Lean cuts of meat.  Proteins low in saturated fats, such as fish, egg whites, and beans. Avoid processed meats.  Fruits and vegetables.  Fruits and vegetables that may help control blood glucose levels, such as apples, mangoes, and yams.  Dairy products.  Choose fat-free or low-fat dairy products, such as milk, yogurt, and cheese.  Grains, bread, pasta, and rice.  Choose whole grain products, such as multigrain bread, whole oats, and brown rice. These foods may help control blood pressure.  Fats.  Foods containing healthful fats, such as nuts, avocado, olive oil, canola oil, and fish. Does everyone with diabetes mellitus have the same meal plan? Because every person with diabetes mellitus is different, there is not one meal plan that works for everyone. It is very important that you meet with a dietitian who will help you create a meal plan that is just right for you. This information is not intended to replace advice  given to you by your health care provider. Make sure you discuss any questions you have with your health care provider. Document Released: 12/15/2004 Document Revised: 08/26/2015 Document Reviewed: 02/14/2013 Elsevier Interactive Patient Education  2017 Elsevier Inc.    Hypotension As your heart beats, it forces blood through your body. This force is called blood pressure. If you have hypotension, you have low blood pressure. When your blood pressure is too low, you  may not get enough blood to your brain. You may feel weak, feel light-headed, have a fast heartbeat, or even pass out (faint). Follow these instructions at home: Eating and drinking   Drink enough fluids to keep your pee (urine) clear or pale yellow.  Eat a healthy diet, and follow instructions from your doctor about eating or drinking restrictions. A healthy diet includes:  Fresh fruits and vegetables.  Whole grains.  Low-fat (lean) meats.  Low-fat dairy products.  Eat extra salt only as told. Do not add extra salt to your diet unless your doctor tells you to.  Eat small meals often.  Avoid standing up quickly after you eat. Medicines   Take over-the-counter and prescription medicines only as told by your doctor.  Follow instructions from your doctor about changing how much you take (the dosage) of your medicines, if this applies.  Do not stop or change your medicine on your own. General instructions   Wear compression stockings as told by your doctor.  Get up slowly from lying down or sitting.  Avoid hot showers and a lot of heat as told by your doctor.  Return to your normal activities as told by your doctor. Ask what activities are safe for you.  Do not use any products that contain nicotine or tobacco, such as cigarettes and e-cigarettes. If you need help quitting, ask your doctor.  Keep all follow-up visits as told by your doctor. This is important. Contact a doctor if:  You throw up  (vomit).  You have watery poop (diarrhea).  You have a fever for more than 2-3 days.  You feel more thirsty than normal.  You feel weak and tired. Get help right away if:  You have chest pain.  You have a fast or irregular heartbeat.  You lose feeling (get numbness) in any part of your body.  You cannot move your arms or your legs.  You have trouble talking.  You get sweaty or feel light-headed.  You faint.  You have trouble breathing.  You have trouble staying awake.  You feel confused. This information is not intended to replace advice given to you by your health care provider. Make sure you discuss any questions you have with your health care provider. Document Released: 06/14/2009 Document Revised: 12/07/2015 Document Reviewed: 12/07/2015 Elsevier Interactive Patient Education  2017 [employer].    IF you received an x-ray today, you will receive an invoice from [facility]. Please contact [facility] at [phone] with questions or concerns regarding your invoice.   IF you received labwork today, you will receive an invoice from [place]. Please contact [facility] at [phone] with questions or concerns regarding your invoice.   Our billing staff will not be able to assist you with questions regarding bills from these companies.  You will be contacted with the lab results as soon as they are available. The fastest way to get your results is to activate your My Chart account. Instructions are located on the last page of this paperwork. If you have not heard from [place] regarding the results in 2 weeks, please contact this office.

## 2016-08-23 NOTE — Progress Notes (Signed)
Brandon [name]  MRN: [id number] DOB: 03-May-1960  Subjective:  Brandon [name] is a 56 y.o. male seen in office today for a chief complaint of follow up on newly diagnosed T2DM. Pt initially seen by me in office on 08/11/16 after being seen at [facility] for an employment physical and being told his sugar was very elevated so he needed to find a PCP. During the visit on 08/11/16, his POCT glucose read as [name] and he had trace ketones and large amount of glucose in his urine. He was having weight loss, frequent urination, blurry vision, polydipsia, dry mouthx 3 weeks. Had lost about 15 lbs at that point. Due to his symptoms and POCT labs, he was sent to ED for further emergent evaluation. Per ED notes, pt's sugar was 609, creatinine of 1.30, normal anion gap. He was given 2 L fluid bolus and 10 units of insulin. Pt's recheck blood sugars was 296. He was discharged at on metformin 500 mg twice a day and told to follow up with primary care provider next week. Pt presented for follow up yesterday. He was still having mouth, urinary frequency, polydipsia, and weight loss. Notes he was feeling more weak than his initial visit. UA showed 500 urine and 15 ketones, poc glucose read [name]. Due to patient's continuing symptoms, glucosuria, worsening ketonuria, and elevated glucose of unknown value, he was once again sent to the ED. Encouraged to follow up with me the day after discharge for initiation of appropriate dose of medication to help gain control of his hyperglycemia. Pt presents today and notes the ED gave him fluids and discharged him last night. Per ED notes, it appears his glucose went from 454 to 229 with IVF. There was no increased anion gap. Amylase and lipase were normal. Pt notes he is feeling much better today. He took 1089m metformin this morning.  Denies nausea, abdominal pain, vomiting, and visual disturbance. He has been further evaluating his diet and notes he has been eating 3-5 fiber  bars a day that have a large amount of sugar in them that he is also going to cut out.   Review of Systems  Per HPI  There are no active problems to display for this patient.   Current Outpatient Prescriptions on File Prior to Visit  Medication Sig Dispense Refill  . blood glucose meter kit and supplies KIT Dispense based on patient and insurance preference. Use up to four times daily as directed. (FOR ICD-9 250.00, 250.01). 1 each 0  . metFORMIN (GLUCOPHAGE) 500 MG tablet Take 1 tablet (500 mg total) by mouth 2 (two) times daily. 60 tablet 0  . Multiple Vitamins-Minerals (MULTIVITAMIN WITH MINERALS) tablet Take 1 tablet by mouth daily.     No current facility-administered medications on file prior to visit.     No Known Allergies   Objective:  BP 124/74   Pulse 77   Temp 98 F (36.7 C) (Oral)   Resp 18   Ht _0  (1.88 m)   Wt 194 lb 6.4 oz (88.2 kg)   SpO2 99%   BMI 24.96 kg/m   Physical Exam  Constitutional: He is oriented to person, place, and time and well-developed, well-nourished, and in no distress.  HENT:  Head: Normocephalic and atraumatic.  Eyes: Conjunctivae are normal.  Neck: Normal range of motion.  Pulmonary/Chest: Effort normal.  Neurological: He is alert and oriented to person, place, and time. Gait normal.  Skin: Skin is warm and dry.  Psychiatric:  Affect normal.  Vitals reviewed.   Assessment and Plan :  1. Newly diagnosed diabetes [facility]) Had an in depth discussion with patient about further management of T2DM at this time. Due to his hyperglycemia and A1C of 13.9, he would mostly likely benefit from insulin at this time. However, pt refuses to do insulin at this time. States "I do not even want to waste your time, I will not use insulin if you prescribe it to me." He also does not want to pay a large amount of money for diabetes medication, which is difficult because he does not have health insurance. Discussed that it is unlikely that oral medication  will keep his glucose controlled at this time; however, he still refused insulin. Will start glimepiride 42m daily along with 10069mmetformin BID. Instructed him to check sugars twice daily with his glucometer. If he is staying consistently in 400s over the next week, return to office in one week. If ranges are more in 200s, follow up in 2 weeks. Given education on carb counting, instructed to keep food diary over the next couple weeks and bring that in to next visit. Pt agrees to see endocrinology at this time. I have placed referral and will manage his care until he gets in with them. Will need to consider adding statin, ACE, and aspirin at future visit once appropriate lab work has been obtained.  - glimepiride (AMARYL) 2 MG tablet; Take 1 tablet (2 mg total) by mouth daily with breakfast.  Dispense: 30 tablet; Refill: 0 - metFORMIN (GLUCOPHAGE) 1000 MG tablet; Take 1 tablet (1,000 mg total) by mouth 2 (two) times daily with a meal.  Dispense: 180 tablet; Refill: 0 - Ambulatory referral to Endocrinology  BrTenna DelainePA-C  [facility] at [place]/2018 1:29 PM

## 2016-09-13 ENCOUNTER — Ambulatory Visit: Payer: Self-pay | Admitting: Physician Assistant

## 2016-11-16 ENCOUNTER — Other Ambulatory Visit: Payer: Self-pay | Admitting: Physician Assistant

## 2016-11-16 DIAGNOSIS — E119 Type 2 diabetes mellitus without complications: Secondary | ICD-10-CM

## 2016-11-22 NOTE — Telephone Encounter (Signed)
Spoke with pt he states that he doesn't need refill at the moment but when he do then he will call to schedule an OV

## 2016-11-24 ENCOUNTER — Ambulatory Visit: Payer: Self-pay | Admitting: Internal Medicine

## 2017-08-31 ENCOUNTER — Ambulatory Visit: Payer: Self-pay | Admitting: Podiatry

## 2017-09-07 ENCOUNTER — Ambulatory Visit (INDEPENDENT_AMBULATORY_CARE_PROVIDER_SITE_OTHER): Payer: Self-pay | Admitting: Podiatry

## 2017-09-07 ENCOUNTER — Other Ambulatory Visit: Payer: Self-pay | Admitting: Physician Assistant

## 2017-09-07 ENCOUNTER — Ambulatory Visit (INDEPENDENT_AMBULATORY_CARE_PROVIDER_SITE_OTHER): Payer: Self-pay

## 2017-09-07 DIAGNOSIS — R0989 Other specified symptoms and signs involving the circulatory and respiratory systems: Secondary | ICD-10-CM

## 2017-09-07 DIAGNOSIS — E119 Type 2 diabetes mellitus without complications: Secondary | ICD-10-CM

## 2017-09-07 DIAGNOSIS — M779 Enthesopathy, unspecified: Secondary | ICD-10-CM

## 2017-09-07 DIAGNOSIS — R52 Pain, unspecified: Secondary | ICD-10-CM

## 2017-09-07 DIAGNOSIS — E1149 Type 2 diabetes mellitus with other diabetic neurological complication: Secondary | ICD-10-CM

## 2017-09-07 NOTE — Progress Notes (Signed)
Subjective:   Patient ID: Brandon [name], male   DOB: 57 y.o.   MRN: [id number]   HPI 57 year old male presents the office today for concerns of numbness and neuropathy pain to both of his legs.  He states is been ongoing for some time.  He says he has seen chiropractors for this and he was told he had herniated disc causing the nerve symptoms to his legs but he states this is been getting worse.  He also states he has an MRI of his back done previously to look at the herniated disc.  He states that times he will get weakness overall in his body when he tries to work out.  He was diagnosed with diabetes last year however he states that he is not diabetic however upon evaluation of his chart he was found to have significantly increased blood sugars.  He does not take medications he stopped this on his own because he thinks that he can be diabetes himself by diet and exercise.  He has not had any follow-up since last year for the diabetes and he does not check his blood sugar.  Denies any open sores.   Review of Systems  All other systems reviewed and are negative.  Past Medical History:  Diagnosis Date  . Diabetes mellitus without complication ([facility])     No past surgical history on file.   Current Outpatient Medications:  .  blood glucose meter kit and supplies KIT, Dispense based on patient and insurance preference. Use up to four times daily as directed. (FOR ICD-9 250.00, 250.01)., Disp: 1 each, Rfl: 0 .  glimepiride (AMARYL) 2 MG tablet, Take 1 tablet (2 mg total) by mouth daily with breakfast., Disp: 30 tablet, Rfl: 0 .  metFORMIN (GLUCOPHAGE) 1000 MG tablet, Take 1 tablet (1,000 mg total) by mouth 2 (two) times daily with a meal., Disp: 180 tablet, Rfl: 0 .  Multiple Vitamins-Minerals (MULTIVITAMIN WITH MINERALS) tablet, Take 1 tablet by mouth daily., Disp: , Rfl:   No Known Allergies        Objective:  Physical Exam  General: AAO x3, NAD  Dermatological: There is no open  lesions or pre-ulcerative lesion identified today.  No callus formation present.  Vascular: DP pulses 2/4, PT pulse decreased bilaterally.  CRT less than 3 seconds.  There is no pain with calf compression, swelling, warmth, erythema.  Normal proximal distal temperature gradient.  Neruologic: Sensation decreased with Derrel Nip monofilament.  Musculoskeletal: There is no area of tenderness identified bilaterally there is no edema, erythema, increase in warmth.  Ankle, subtalar joint range of motion intact but any restrictions.  Muscular strength 5/5 in all groups tested bilateral.  Gait: Unassisted, Nonantalgic.       Assessment:   57 year old male with uncontrolled diabetes, neuropathy and concern for PAD    Plan:  -Treatment options discussed including all alternatives, risks, and complications -Etiology of symptoms were discussed -X-rays were obtained and reviewed with the patient.  There is no evidence of acute fracture or stress fracture.  Vessel calcification is present. -I checked his blood sugar today was 587.  We had a long discussion today regarding importance of blood sugar control.  We discussed risks of having significantly elevated blood sugar for this long.  He has had increased urination at nighttime as well as weight loss.  Given his high blood sugar I did recommend him going to the emergency room I strongly advised him to do this.  He kept asking if there  is more natural ways to decrease his blood sugar but he has attempted diet exercise and this is not been helping his blood sugar significantly elevated and I discussed that he has to go to the emergency room for this.  I try to get him into a primary care urgent care however given his blood sugar they advised the emergency room.  I discussed this with him in great detail the importance of doing this.  He needs to get up with a primary care physician long-term.   -I do think he is having neuropathy symptoms to his legs  however I am also concerned about PAD.  Best with application of decreased pulses in his symptoms as well.  Did describe some weakness in his legs.  I will order an arterial duplex for him.    Trula Slade DPM

## 2017-09-07 NOTE — Telephone Encounter (Signed)
Provider review for :  Metformin 1000 mg tab LOV  08/23/16 NOV  09/08/17 [place], [place]  HA1C 13.9 okn 08/11/16

## 2017-09-07 NOTE — Progress Notes (Signed)
g foot  

## 2017-09-08 ENCOUNTER — Ambulatory Visit: Payer: Self-pay | Admitting: Physician Assistant

## 2017-09-10 ENCOUNTER — Telehealth: Payer: Self-pay | Admitting: *Deleted

## 2017-09-10 DIAGNOSIS — R0989 Other specified symptoms and signs involving the circulatory and respiratory systems: Secondary | ICD-10-CM

## 2017-09-10 NOTE — Telephone Encounter (Signed)
Orders faxed to [facility]. 

## 2017-09-10 NOTE — Telephone Encounter (Signed)
-----   Message from Vivi BarrackMatthew R Wagoner, DPM sent at 09/07/2017  3:44 PM EDT ----- Can you please order an arterial duplex for him due to decreased pulses?

## 2017-09-12 ENCOUNTER — Telehealth: Payer: Self-pay | Admitting: *Deleted

## 2017-09-12 ENCOUNTER — Ambulatory Visit (HOSPITAL_COMMUNITY)
Admission: RE | Admit: 2017-09-12 | Discharge: 2017-09-12 | Disposition: A | Discharge status: Home / Self Care | Payer: Self-pay | Source: Ambulatory Visit | Attending: Cardiology | Admitting: Cardiology

## 2017-09-12 DIAGNOSIS — R0989 Other specified symptoms and signs involving the circulatory and respiratory systems: Secondary | ICD-10-CM | POA: Insufficient documentation

## 2017-09-12 DIAGNOSIS — E119 Type 2 diabetes mellitus without complications: Secondary | ICD-10-CM | POA: Insufficient documentation

## 2017-09-12 NOTE — Telephone Encounter (Signed)
Called and left a message for the patient to call me back-I was calling to see if the patient had gone to the ER or to the PCP to have a check of the BS 582 that was done on 09-07-2017 and left my number [phone]. Misty StanleyLisa

## 2017-09-17 ENCOUNTER — Encounter: Payer: Self-pay | Admitting: Internal Medicine

## 2017-09-26 ENCOUNTER — Telehealth: Payer: Self-pay | Admitting: *Deleted

## 2017-09-26 NOTE — Telephone Encounter (Signed)
I informed pt of Dr. Gabriel RungWagoner's review of doppler of 09/12/2017 results and asked if he had been to the ED or Urgent Care for his sugar. Pt states he wasn't going to the hospital for a big bill, he knows his body and he is off the protein and carbohydrates, and not using any sugars, that were building up in his pancreas. I told pt that diet could help but he needed to be followed by a PCP or endocrinologist for his diabetes. Pt states what he needs is to have someone treat his neuropathy. I told him that if his diabetes was not controlled that would make his neuropathy worse. I [facility] to [facility] [phone], [facility] or [facility]. I spoke with pt over 12minutes on the importance of managing his diabetes.

## 2017-09-26 NOTE — Telephone Encounter (Signed)
I [facility] with the [facility] [phone], and told him they would be able to get him in a program that would help manage his health and cost.

## 2018-01-21 ENCOUNTER — Ambulatory Visit (INDEPENDENT_AMBULATORY_CARE_PROVIDER_SITE_OTHER): Payer: Self-pay | Admitting: Internal Medicine

## 2018-01-21 ENCOUNTER — Encounter: Payer: Self-pay | Admitting: Internal Medicine

## 2018-01-21 VITALS — BP 118/72 | HR 85 | Ht 74.0 in | Wt 169.6 lb

## 2018-01-21 DIAGNOSIS — E1142 Type 2 diabetes mellitus with diabetic polyneuropathy: Secondary | ICD-10-CM | POA: Insufficient documentation

## 2018-01-21 DIAGNOSIS — E1165 Type 2 diabetes mellitus with hyperglycemia: Secondary | ICD-10-CM

## 2018-01-21 LAB — POCT GLYCOSYLATED HEMOGLOBIN (HGB A1C): HbA1c POC (<> result, manual entry): >15 % (ref 4.0–5.6)

## 2018-01-21 LAB — GLUCOSE, POCT (MANUAL RESULT ENTRY): POC Glucose: 532 mg/dl — AB (ref 70–99)

## 2018-01-21 MED ORDER — FREESTYLE LIBRE 14 DAY SENSOR MISC: 2.0000 "application " | Freq: Three times a day (TID) | 11 refills | Status: DC

## 2018-01-21 MED ORDER — FREESTYLE LIBRE 14 DAY READER DEVI: 1.0000 | Freq: Three times a day (TID) | 0 refills | Status: DC

## 2018-01-21 MED ORDER — GLYBURIDE 5 MG PO TABS: 5.0000 mg | ORAL_TABLET | Freq: Two times a day (BID) | ORAL | 6 refills | Status: DC

## 2018-01-21 NOTE — Patient Instructions (Addendum)
-   Check sugar twice a day fasting and bedtime  - If you are going to use the "freestyle Libre sensor" please check at least every 8 hours.  - Start Glyburide 5 mg twice a day with meals  - HOW TO TREAT LOW BLOOD SUGARS (Blood sugar LESS THAN 70 MG/DL)  Please follow the RULE OF 15 for the treatment of hypoglycemia treatment (when your (blood sugars are less than 70 mg/dL)    STEP 1: Take 15 grams of carbohydrates when your blood sugar is low, which includes:   3-4 GLUCOSE TABS  OR  3-4 OZ OF JUICE OR REGULAR SODA OR  ONE TUBE OF GLUCOSE GEL     STEP 2: RECHECK blood sugar in 15 MINUTES STEP 3: If your blood sugar is still low at the 15 minute recheck --> then, go back to STEP 1 and treat AGAIN with another 15 grams of carbohydrates.

## 2018-01-21 NOTE — Progress Notes (Signed)
Name: Brandon [name]  MRN/ DOB: [id number], 1960/09/17   Age/ Sex: 57 y.o., male    PCP: Patient, No Pcp Per   Reason for Endocrinology Evaluation: Type 2 Diabetes Mellitus     Date of Initial Endocrinology Visit: 01/21/2018     PATIENT IDENTIFIER: Brandon [name] is a 57 y.o. male with a past medical history of DM and spinal stenosis. The patient presented for initial ndocrinology clinic visit on 01/21/2018 for consultative assistance with his diabetes management.    HPI: Brandon [name] was referred to me from [facility] . He was seen there for spinal stenosis and neurological claudication. During his visit he was found to have a BG of > 500 mg/dL with an A1c of 15.3% . He initially declined all treatments but once he became open to medications, he was referred here.    Diagnosed with DM in 2017 Prior Medications tried: He has declined Metformin and Glimepiride in the past.  Hypoglycemia episodes : 0     Hemoglobin A1c has ranged from 13.9% in 2018, peaking at 15.3% in 2019. Currently checking blood sugars 0 x / day. Patient required assistance for hypoglycemia: no Patient has required hospitalization within the last 1 year from hyper or hypoglycemia: no No hx of DKA's in the past   In terms of diet, the patient avoids sugar-sweetened beverages . He eats three meals a day with minimal snacking.    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 Diabetes mellitus without complication [facility])     Past  Surgical History: No past surgical history on file.   Social History:  reports that he has never smoked. He has never used smokeless tobacco. He reports that he does not drink alcohol or use drugs. Family History:  Family History  Problem Relation Age of Onset  . Diabetes Mother   . Diabetes Maternal Grandmother      HOME MEDICATIONS: Allergies as of 01/21/2018   No Known Allergies     Medication List        Accurate as of 01/21/18  4:41 PM. Always use your most recent med list.          blood glucose meter kit and supplies Kit Dispense based on patient and insurance preference. Use up to four times daily as directed. (FOR ICD-9 250.00, 250.01).   FREESTYLE LIBRE 14 DAY READER Devi 1 applicator by Does not apply route every 8 (eight) hours.   FREESTYLE LIBRE 14 DAY SENSOR Misc 2 application by Does not apply route every 8 (eight) hours.   glyBURIDE 5 MG tablet Commonly known as:  DIABETA Take 1 tablet (5 mg total) by mouth 2 (two) times daily with a meal.   multivitamin with minerals tablet Take 1 tablet by mouth daily.        ALLERGIES: No Known Allergies   REVIEW OF SYSTEMS: A comprehensive ROS was conducted with the patient and is negative except as per HPI and below:  Review of Systems  Constitutional: Positive for  malaise/fatigue and weight loss.  HENT: Negative.   Eyes: Negative.   Respiratory: Negative.   Cardiovascular: Negative.   Gastrointestinal: Negative.   Genitourinary: Positive for frequency.  Musculoskeletal: Positive for back pain.  Skin: Negative.   Neurological: Positive for tingling. Negative for tremors.  Endo/Heme/Allergies: Negative.   Psychiatric/Behavioral: Negative for depression. The patient is not nervous/anxious.       OBJECTIVE:   VITAL SIGNS: BP 118/72 (BP Location: Left Arm)   Pulse 85   Ht _0  (1.88 m)   Wt 76.9 kg   SpO2 97%   BMI 21.78 kg/m    PHYSICAL EXAM:  General: Pt appears well and is in NAD    Hydration: Well-hydrated with moist mucous membranes and good skin turgor  HEENT: Head: Unremarkable with good dentition. Oropharynx clear without exudate.  Eyes: External eye exam normal without stare, lid lag or exophthalmos.  EOM intact.  PERRL.  Neck: General: Supple without adenopathy or carotid bruits. Thyroid: Thyroid size normal.  No goiter or nodules appreciated. No thyroid bruit.  Lungs: Clear with good BS bilat with no rales, rhonchi, or wheezes  Heart: RRR with normal S1 and S2 and no gallops; no murmurs; no rub  Abdomen: Normoactive bowel sounds, soft, nontender, without masses or organomegaly palpable  Extremities:  Lower extremities - No pretibial edema. No lesions.  Skin: Normal texture and temperature to palpation. No rash noted. No Acanthosis nigricans/skin tags. No lipohypertrophy.  Neuro: MS is good with appropriate affect, pt is alert and Ox3    DM foot exam: Deferred   DATA REVIEWED: A1c: 09/2017 15.3%  FSBS in office (532 mg/dL )  Results for DEMARIAN, EPPS (MRN [id number]) as of 01/21/2018 14:57  Ref. Range 08/22/2016 14:18  Sodium Latest Ref Range: 135 - 145 mmol/L 133 (L)  Potassium Latest Ref Range: 3.5 - 5.1 mmol/L 4.3  Chloride Latest Ref Range: 101 - 111 mmol/L 101  CO2 Latest Ref Range: 22 - 32 mmol/L 23  Glucose Latest Ref Range: 65 - 99 mg/dL 451 (H)  BUN Latest Ref Range: 6 - 20 mg/dL 20  Creatinine Latest Ref Range: 0.61 - 1.24 mg/dL 1.07  Calcium Latest Ref Range: 8.9 - 10.3 mg/dL 9.4  Anion gap Latest Ref Range: 5 - 15  9  GFR, Est Non African American Latest Ref Range: >60 mL/min >60  GFR, Est African American Latest Ref Range: >60 mL/min >60      ASSESSMENT / PLAN / RECOMMENDATIONS:   1) Type 2 Diabetes Mellitus ,poorly  controlled, With microvascular complications - Most recent A1c of 15.3 %. Goal A1c < 7.0 %.    Plan: GENERAL:  Patient has been in denial for years about his diabetes diagnosis. He initially declined all meds but he  is relaizing now that diabetes is affecting him. He has a strong FH of diabetes with mother being on insulin.   We discussed complications of long term hyperglycemia such as sigh loss, renal damage and neuropathy with increased risk of infection.  We discussed pathophysiology of DM , my initial recommendations is multiple daily doses of insulin to bring his glucose down then we can gradually decrease insulin with addition of oral glycemic agents but patient declined all kind of insulin. He also declined to check finger stick at this time.   He is requesting to try the freestyle Elenor Legato- I will send a prescription but I cautioned him about insurance requirements and coverage.   We agreed on trying sulfonyurea at this time.  We discussed risk of hypoglycemia with SU .     MEDICATIONS:  Glyburide 5 mg BID with meals   EDUCATION / INSTRUCTIONS:  BG monitoring instructions: Patient is instructed to check his blood sugars 2 times a day, fasting and bedtime.  Call [facility] if: BG persistently < 70 or > 300. . I reviewed the Rule of 15 for the treatment of hypoglycemia in detail with the patient. Literature supplied.   2) Diabetic complications:   Eye: Does not have known diabetic retinopathy. Last eye exam was   Neuro/ Feet: Does have known diabetic peripheral neuropathy.  Renal: Patient does not have known baseline CKD. He is not on an ACEI/ARB at present.   3) Lipids: Patient is not on a statin.     F/U in 4 weeks.      Signed electronically by: Mack Guise, MD  [facility] Endocrinology  [facility] [address] [place], [place] [address] Phone: [phone] FAX: [phone]   CC: Patient, No Pcp Per No address on file Phone: None  Fax: None    Return to Endocrinology clinic as below: No future appointments.

## 2018-02-22 ENCOUNTER — Encounter: Payer: Self-pay | Admitting: Internal Medicine

## 2018-02-22 ENCOUNTER — Ambulatory Visit (INDEPENDENT_AMBULATORY_CARE_PROVIDER_SITE_OTHER): Payer: Self-pay | Admitting: Internal Medicine

## 2018-02-22 VITALS — BP 130/60 | HR 87 | Ht 74.0 in | Wt 178.0 lb

## 2018-02-22 DIAGNOSIS — E1165 Type 2 diabetes mellitus with hyperglycemia: Secondary | ICD-10-CM

## 2018-02-22 LAB — POCT GLYCOSYLATED HEMOGLOBIN (HGB A1C)

## 2018-02-22 LAB — POCT GLUCOSE (DEVICE FOR HOME USE): POC GLUCOSE: 582 mg/dL — AB (ref 70–99)

## 2018-02-22 MED ORDER — GLYBURIDE 5 MG PO TABS: 10.0000 mg | ORAL_TABLET | Freq: Two times a day (BID) | ORAL | 6 refills | Status: DC

## 2018-02-22 NOTE — Progress Notes (Signed)
Name: Brandon [name]  Age/ Sex: 57 y.o., male   MRN/ DOB: [id number], 01-25-1961     PCP: Patient, No Pcp Per   Reason for Endocrinology Evaluation: Type 2 Diabetes Mellitus  Initial Endocrine Consultative Visit: 01/21/18    PATIENT IDENTIFIER: Brandon [name] is a 57 y.o. male with a past medical history of spinal stenosis and T2DM. The patient has followed with Endocrinology clinic since 01/21/18 for consultative assistance with management of his diabetes.  DIABETIC HISTORY:  Brandon [name] was diagnosed with T2DM in 2017, he had refused to accept his diagnosis and declined all medications for it in the past. In 2019, he went to [facility] and found to have a BG > 500 mg/dL and was referred here. His hemoglobin A1c has ranged from 13.9% in 2018, peaking at 15.3% in 2019.   SUBJECTIVE:   During the last visit (01/21/18): His A1c was 15.3% He agreed to start Glyburide.  Today (02/22/2018): Mr. Chap is here for a 4 week follow up no his diabetes. He checks his blood sugars 0 times daily. He was planning on getting the [facility] but was too costly. He refuses to check his glucose at home. The patient has not had hypoglycemic episodes since the last clinic visit.Otherwise, the patient has not required any recent emergency interventions for hypoglycemia and has not had recent hospitalizations secondary to hyper or hypoglycemic episodes.    ROS: As per HPI and as detailed below: Review of Systems  Constitutional: Negative for weight loss.  HENT: Negative for congestion and sore throat.   Respiratory: Negative for cough and shortness of breath.   Cardiovascular: Negative for chest pain and palpitations.      HOME DIABETES REGIMEN:  Glyburide 5 mg BID    METER DOWNLOAD SUMMARY: Did not bring    HISTORY:  Past Medical History:  Past Medical History:  Diagnosis Date  .  Diabetes mellitus without complication [facility])      Past Surgical History: No past surgical history on file.  Social History:  reports that he has never smoked. He has never used smokeless tobacco. He reports that he does not drink alcohol or use drugs. Family History:  Family History  Problem Relation Age of Onset  . Diabetes Mother   . Diabetes Maternal Grandmother       HOME MEDICATIONS: Allergies as of 02/22/2018   No Known Allergies     Medication List        Accurate as of 02/22/18 12:39 PM. Always use your most recent med list.          blood glucose meter kit and supplies Kit Dispense based on patient and insurance preference. Use up to four times daily as directed. (FOR ICD-9 250.00, 250.01).   FREESTYLE LIBRE 14 DAY READER Devi 1 applicator by Does not apply route every 8 (eight) hours.   FREESTYLE LIBRE 14 DAY SENSOR Misc 2 application by Does not apply route every 8 (eight) hours.   glyBURIDE 5 MG tablet Commonly known as:  DIABETA Take 1 tablet (5 mg total) by mouth 2 (two) times daily with a meal.   multivitamin with minerals tablet Take 1 tablet by mouth daily.        OBJECTIVE:   Vital Signs: There were no vitals taken for this visit.  Wt Readings from Last 3 Encounters:  01/21/18 169 lb 9.6 oz (76.9 kg)  08/23/16 194 lb 6.4 oz (88.2 kg)  08/22/16 190 lb (86.2 kg)  Exam: General: Pt appears well and is in NAD  Hydration: Well-hydrated with moist mucous membranes and good skin turgor  HEENT: Head: Unremarkable with good dentition. Oropharynx clear without exudate.  Eyes: External eye exam normal without stare, lid lag or exophthalmos.  EOM intact.  PERRL.  Neck: General: Supple without adenopathy. Thyroid: Thyroid size normal.  No goiter or nodules appreciated. No thyroid bruit.  Lungs: Clear with good BS bilat with no rales, rhonchi, or wheezes  Heart: RRR with normal S1 and S2 and no gallops; no murmurs; no rub  Abdomen:  Normoactive bowel sounds, soft, nontender, without masses or organomegaly palpable  Extremities: No pretibial edema. No tremor. Normal strength and motion throughout. See detailed diabetic foot exam below.  Skin: Normal texture and temperature to palpation. No rash noted. No Acanthosis nigricans/skin tags. No lipohypertrophy.  Neuro: MS is good with appropriate affect, pt is alert and Ox3    DM foot exam: Please see diabetic assessment flow-sheet detailed below:           DATA REVIEWED:  Lab Results  Component Value Date   HGBA1C >15.0 01/21/2018   HGBA1C 13.9 08/11/2016     ASSESSMENT / PLAN / RECOMMENDATIONS:   1) Type 2 Diabetes Mellitus, poorly controlled, With neuropathic complications - Most recent A1c of 15.3 %. Goal A1c < 7.0 %.   Plan:  - Patient was not able to afford the [facility] - He has been compliant with Glyburide and has felt better being on it, with weight gain and more energy.  - He refused to check his glucose at home - I discussed with him that its very clear that glyburide is not sufficient to reduce his glucose. I explained to him that my preferable plan is to start him on  basal insulin to improve his glycemic control and we can experiment with other oral medications.  - Patient declined insulin or injectables, stating he sees mother gives herself insulin and he is worries that he will never get off of it. I explained to him that I could not guarantee he will able to get off of insulin in the future but we discussed risk of hyperglycemia and in microvascular and macrovascular complications.  I explained to him that controlling glucose will slow down the progression of neuropathy but that it will not reverse it at this time.  - In the meantime , will increase glyburide and see how his A1c does on next visit  - His in-office glucose was > 500 g/dL. He ate approximately an hour ago   MEDICATIONS: Increase Glyburide to 10 mg BID with meals  EDUCATION  / INSTRUCTIONS:  BG monitoring instructions: Patient is instructed to check his blood sugars 2 times a day,fasting and bedtime.  Call [facility] if: BG persistently < 70 or > 300. . I reviewed the Rule of 15 for the treatment of hypoglycemia in detail with the patient. Literature supplied.      F/U in 2 months    Signed electronically by: Mack Guise, MD  [facility] Endocrinology  [facility] [address] [place], [place] [address] Phone: [phone] FAX: [phone]   CC: Patient, No Pcp Per No address on file Phone: None  Fax: None  Return to Endocrinology clinic as below: Future Appointments  Date Time Provider [place]  02/22/2018  1:00 PM Shamleffer, Melanie Crazier, MD [id number] None

## 2018-02-22 NOTE — Patient Instructions (Signed)
Increase Glyburide to 10 mg (two tablets) Twice a day with meals - You will taking a total of 4 tablets a day

## 2018-04-25 DIAGNOSIS — Z0271 Encounter for disability determination: Secondary | ICD-10-CM

## 2018-04-26 ENCOUNTER — Ambulatory Visit: Payer: Self-pay | Admitting: Internal Medicine

## 2018-05-17 ENCOUNTER — Ambulatory Visit (INDEPENDENT_AMBULATORY_CARE_PROVIDER_SITE_OTHER): Payer: Self-pay | Admitting: Internal Medicine

## 2018-05-17 ENCOUNTER — Encounter: Payer: Self-pay | Admitting: Internal Medicine

## 2018-05-17 VITALS — BP 132/80 | HR 70 | Ht 74.0 in | Wt 186.0 lb

## 2018-05-17 DIAGNOSIS — E1165 Type 2 diabetes mellitus with hyperglycemia: Secondary | ICD-10-CM

## 2018-05-17 DIAGNOSIS — E1142 Type 2 diabetes mellitus with diabetic polyneuropathy: Secondary | ICD-10-CM

## 2018-05-17 LAB — HEMOGLOBIN A1C: Hgb A1c MFr Bld: 15.5 % — ABNORMAL HIGH (ref 4.6–6.5)

## 2018-05-17 LAB — MICROALBUMIN / CREATININE URINE RATIO
Creatinine,U: 56.7 mg/dL
Microalb Creat Ratio: 1.2 mg/g (ref 0.0–30.0)
Microalb, Ur: <0.7 mg/dL (ref 0.0–1.9)

## 2018-05-17 LAB — BASIC METABOLIC PANEL
BUN: 28 mg/dL — ABNORMAL HIGH (ref 6–23)
CO2: 26 mEq/L (ref 19–32)
Calcium: 9.6 mg/dL (ref 8.4–10.5)
Chloride: 98 mEq/L (ref 96–112)
Creatinine, Ser: 1.29 mg/dL (ref 0.40–1.50)
GFR: 69.73 mL/min (ref >60.00)
Glucose, Bld: 336 mg/dL — ABNORMAL HIGH (ref 70–99)
Potassium: 4.2 mEq/L (ref 3.5–5.1)
Sodium: 135 mEq/L (ref 135–145)

## 2018-05-17 MED ORDER — GLYBURIDE 5 MG PO TABS: 10.0000 mg | ORAL_TABLET | Freq: Two times a day (BID) | ORAL | 6 refills | Status: DC

## 2018-05-17 NOTE — Progress Notes (Signed)
Name: Brandon [name]  Age/ Sex: 58 y.o., male   MRN/ DOB: [id number], Jul 14, 1960     PCP: Patient, No Pcp Per   Reason for Endocrinology Evaluation: Type 2 Diabetes Mellitus  Initial Endocrine Consultative Visit: 01/21/18    PATIENT IDENTIFIER: Mr. Brandon [name] is a 58 y.o. male with a past medical history of spinal stenosis and T2DM. The patient has followed with Endocrinology clinic since 01/21/18 for consultative assistance with management of his diabetes.  DIABETIC HISTORY:  Mr. Brandon [name] was diagnosed with T2DM in 2017, he had refused to accept his diagnosis and declined all medications for it in the past. In 2019, he went to [facility] and found to have a BG > 500 mg/dL and was referred here. His hemoglobin A1c has ranged from 13.9% in 2018, peaking at 15.3% in 2019.   SUBJECTIVE:   During the last visit (02/22/18): We increased Glyburide to 10 mg BID   Today (05/20/2018): Mr. Brandon [name] is here for a 3 month follow up no his diabetes. He refuses to check his blood glucose.  For years he has declined to start on any glycemic agents, but he finally agreed to oral glycemic agents in October 2019.  The patient has not had hypoglycemic episodes since the last clinic visit.Otherwise, the patient has not required any recent emergency interventions for hypoglycemia and has not had recent hospitalizations secondary to hyper or hypoglycemic episodes.  He thinks he is doing better as he is feeling better, he feels his energy has improved, and he has gained his weight back.  ROS: As per HPI and as detailed below: Review of Systems  Constitutional: Negative for weight loss.  HENT: Negative for congestion and sore throat.   Respiratory: Negative for cough and shortness of breath.   Cardiovascular: Negative for chest pain and palpitations.  Genitourinary: Positive for frequency.  Musculoskeletal:  Positive for joint pain.  Neurological: Positive for tingling.  Endo/Heme/Allergies: Negative for polydipsia.      HOME DIABETES REGIMEN:  Glyburide 10 mg BID    METER DOWNLOAD SUMMARY: Did not bring    HISTORY:  Past Medical History:  Past Medical History:  Diagnosis Date  . Diabetes mellitus without complication [facility])     Past Surgical History: History reviewed. No pertinent surgical history.  Social History:  reports that he has never smoked. He has never used smokeless tobacco. He reports that he does not drink alcohol or use drugs. Family History:  Family History  Problem Relation Age of Onset  . Diabetes Mother   . Diabetes Maternal Grandmother      HOME MEDICATIONS: Allergies as of 05/17/2018   No Known Allergies     Medication List       Accurate as of May 17, 2018 11:59 PM. Always use your most recent med list.        glyBURIDE 5 MG tablet Commonly known as:  DIABETA Take 2 tablets (10 mg total) by mouth 2 (two) times daily with a meal.   multivitamin with minerals tablet Take 1 tablet by mouth daily.   nateglinide 60 MG tablet Commonly known as:  STARLIX Take 1 tablet (60 mg total) by mouth 3 (three) times daily with meals.        OBJECTIVE:   Vital Signs: BP 132/80   Pulse 70   Ht 6\' 2"  (1.88 m)   Wt 186 lb (84.4 kg)   SpO2 97%   BMI 23.88 kg/m   Wt Readings from Last 3  Encounters:  05/17/18 186 lb (84.4 kg)  02/22/18 178 lb (80.7 kg)  01/21/18 169 lb 9.6 oz (76.9 kg)     Exam: General: Pt appears well and is in NAD  Lungs: Clear with good BS bilat with no rales, rhonchi, or wheezes  Heart: RRR with normal S1 and S2 and no gallops; no murmurs; no rub  Abdomen: Normoactive bowel sounds, soft, nontender, without masses or organomegaly palpable  Extremities: No pretibial edema. No tremor.  Neuro: MS is good with appropriate affect, pt is alert and Ox3    DM foot exam:   05/17/2018 The skin of the feet is intact without  sores or ulcerations.  Plantar callus formation has been noted The pedal pulses are 2+ on right and 2+ on left. The sensation is intact to a screening 5.07, 10 gram monofilament bilaterally     DATA REVIEWED:  Lab Results  Component Value Date   HGBA1C 15.5 (H) 05/17/2018   HGBA1C >15 02/22/2018   HGBA1C >15.0 01/21/2018   Results for Brandon [name], Brandon [name] (MRN [id number]) as of 05/20/2018 12:58  Ref. Range 05/17/2018 15:50  Sodium Latest Ref Range: 135 - 145 mEq/L 135  Potassium Latest Ref Range: 3.5 - 5.1 mEq/L 4.2  Chloride Latest Ref Range: 96 - 112 mEq/L 98  CO2 Latest Ref Range: 19 - 32 mEq/L 26  Glucose Latest Ref Range: 70 - 99 mg/dL 191336 (H)  BUN Latest Ref Range: 6 - 23 mg/dL 28 (H)  Creatinine Latest Ref Range: 0.40 - 1.50 mg/dL 4.781.29  Calcium Latest Ref Range: 8.4 - 10.5 mg/dL 9.6  GFR Latest Ref Range: >60.00 mL/min 69.73  MICROALB/CREAT RATIO Latest Ref Range: 0.0 - 30.0 mg/g 1.2  Hemoglobin A1C Latest Ref Range: 4.6 - 6.5 % 15.5 (H)  C-Peptide Latest Ref Range: 0.80 - 3.85 ng/mL 1.09    ASSESSMENT / PLAN / RECOMMENDATIONS:   1) Type 2 Diabetes Mellitus, poorly controlled, With neuropathic complications - Most recent A1c of 15.5 %. Goal A1c < 7.0 %.   Plan:  -Patient has refused glycemic agents since 2017, after a lot of discussion about diabetes complications and his bothersome neuropathy he had agreed to start oral glycemic agents in October 2019. -He continues to refuse glucose checks at home, patient refuses insulin or any injectable medications. - He has been compliant with Glyburide and has felt better being on it, with weight gain and more energy.  -He understands that he  is maxed out on glyburide dose, his A1c continues to be at 15.5%, again we discussed his dire need for insulin but he declines, pt would take anything but insulin.  - I explained to him his increased risk of renal failure and the need for dialysis, we discussed worsening neuropathy and blindness.    - I would like to start him on another agent that will maximize his endogenous insulin production but will also increase his risk of hypoglycemia. Will start Nateglinide, he was strongly encouraged to take it with a meal, discussed risk of hypoglycemia especially with someone who refuses to check glucose, as a compromise, I will set him up in 6 weeks for a serum glucose.   - His stimulated C-Peptide is normal , which indicates the presence of endogenous insulin production but not sufficient.    MEDICATIONS: Continue glyburide  10 mg BID with meals Start Nateglinide 60 mg TID with meals     EDUCATION / INSTRUCTIONS:  BG monitoring instructions: Patient is instructed to check his blood sugars  2 times a day,fasting and bedtime.  Call [facility] if: BG persistently < 70 or > 300. . I reviewed the Rule of 15 for the treatment of hypoglycemia in detail with the patient. Literature supplied.      F/U in 3 months    Signed electronically by: Lyndle Herrlich, MD  [facility] Endocrinology  [facility] [address] [place], [place] [address] Phone: [phone] FAX: [phone]   CC: Patient, No Pcp Per No address on file Phone: None  Fax: None  Return to Endocrinology clinic as below: Future Appointments  Date Time Provider Department Center  08/16/2018  3:00 PM Shamleffer, Konrad Dolores, MD [facility] None

## 2018-05-17 NOTE — Patient Instructions (Signed)
-   Continue Glyburide 10 mg Twice a day with meals.

## 2018-05-20 ENCOUNTER — Telehealth: Payer: Self-pay | Admitting: Internal Medicine

## 2018-05-20 LAB — C-PEPTIDE: C-Peptide: 1.09 ng/mL (ref 0.80–3.85)

## 2018-05-20 MED ORDER — NATEGLINIDE 60 MG PO TABS: 60.0000 mg | ORAL_TABLET | Freq: Three times a day (TID) | ORAL | 6 refills | Status: DC

## 2018-05-20 NOTE — Telephone Encounter (Signed)
Forwarding

## 2018-05-20 NOTE — Telephone Encounter (Signed)
Patient requests to be called at ph# [phone] to be given his lab test results

## 2018-05-20 NOTE — Telephone Encounter (Signed)
Please advise 

## 2018-05-21 NOTE — Telephone Encounter (Signed)
I already discussed the results with him yesterday around lunch time.

## 2018-06-28 ENCOUNTER — Other Ambulatory Visit: Payer: Self-pay

## 2018-07-01 ENCOUNTER — Other Ambulatory Visit: Payer: Self-pay

## 2018-08-16 ENCOUNTER — Other Ambulatory Visit: Payer: Self-pay

## 2018-08-16 ENCOUNTER — Ambulatory Visit: Payer: Self-pay | Admitting: Internal Medicine

## 2018-08-16 ENCOUNTER — Telehealth: Payer: Self-pay | Admitting: Internal Medicine

## 2018-08-16 ENCOUNTER — Encounter: Payer: Self-pay | Admitting: Internal Medicine

## 2018-08-16 VITALS — BP 132/92 | HR 74 | Temp 98.1°F | Ht 74.0 in | Wt 183.6 lb

## 2018-08-16 DIAGNOSIS — E1165 Type 2 diabetes mellitus with hyperglycemia: Secondary | ICD-10-CM

## 2018-08-16 DIAGNOSIS — E1142 Type 2 diabetes mellitus with diabetic polyneuropathy: Secondary | ICD-10-CM

## 2018-08-16 LAB — POCT GLYCOSYLATED HEMOGLOBIN (HGB A1C): HbA1c POC (<> result, manual entry): >14 % (ref 4.0–5.6)

## 2018-08-16 LAB — GLUCOSE, POCT (MANUAL RESULT ENTRY): POC Glucose: 510 mg/dl — AB (ref 70–99)

## 2018-08-16 MED ORDER — NATEGLINIDE 120 MG PO TABS: 120.0000 mg | ORAL_TABLET | Freq: Three times a day (TID) | ORAL | 6 refills | Status: DC

## 2018-08-16 MED ORDER — PIOGLITAZONE HCL 30 MG PO TABS: 30.0000 mg | ORAL_TABLET | Freq: Every day | ORAL | 6 refills | Status: DC

## 2018-08-16 NOTE — Progress Notes (Signed)
Name: Brandon [name]  Age/ Sex: 58 y.o., male   MRN/ DOB: [id number], [birth year]     PCP: Patient, No Pcp Per   Reason for Endocrinology Evaluation: Type 2 Diabetes Mellitus  Initial Endocrine Consultative Visit: 01/21/18    PATIENT IDENTIFIER: Brandon [name] is a 58 y.o. male with a past medical history of spinal stenosis and T2DM. The patient has followed with Endocrinology clinic since 01/21/18 for consultative assistance with management of his diabetes.  DIABETIC HISTORY:  Brandon [name] was diagnosed with T2DM in 2017, he had refused to accept his diagnosis and declined all medications in the past. In 2019, he went to [facility] and found to have a BG > 500 mg/dL and was referred here. His hemoglobin A1c has ranged from 13.9% in 2018, peaking at 15.3% in 2019.  Glyburide was started in 01/2018, Nateglinide was started in 05/2018 He continues to refuse to check his glucose.   SUBJECTIVE:   During the last visit (05/17/2018): A1c was at 15.5%. Continued Glyburide , started nateglinide 60 mg TID   Today (08/19/2018): Brandon [name] is here for a 3 month follow up no his diabetes. He refuses to check his blood glucose at home.  For years he has declined to start on any glycemic agents, but he finally agreed to oral glycemic agents in October 2019.  The patient has not had hypoglycemic episodes since the last clinic visit.Otherwise, the patient has not required any recent emergency interventions for hypoglycemia and has not had recent hospitalizations secondary to hyper or hypoglycemic episodes.    ROS: As per HPI and as detailed below: Review of Systems  Constitutional: Negative for weight loss.  HENT: Negative for congestion and sore throat.   Respiratory: Negative for cough and shortness of breath.   Cardiovascular: Negative for chest pain and palpitations.  Genitourinary: Positive for frequency.  Musculoskeletal: Positive for joint pain.  Neurological:  Positive for tingling.  Endo/Heme/Allergies: Negative for polydipsia.      HOME DIABETES REGIMEN:  Glyburide 10 mg BID Nateglinide 60 mg TID QAC    METER DOWNLOAD SUMMARY: Does not check   HISTORY:  Past Medical History:  Past Medical History:  Diagnosis Date  . Diabetes mellitus without complication [facility](HCC)     Past Surgical History: No past surgical history on file.  Social History:  reports that he has never smoked. He has never used smokeless tobacco. He reports that he does not drink alcohol or use drugs. Family History:  Family History  Problem Relation Age of Onset  . Diabetes Mother   . Diabetes Maternal Grandmother      HOME MEDICATIONS: Allergies as of 08/16/2018   No Known Allergies     Medication List       Accurate as of Aug 16, 2018 11:59 PM. If you have any questions, ask your nurse or doctor.        glyBURIDE 5 MG tablet Commonly known as:  Diabeta Take 2 tablets (10 mg total) by mouth 2 (two) times daily with a meal.   multivitamin with minerals tablet Take 1 tablet by mouth daily.   nateglinide 120 MG tablet Commonly known as:  STARLIX Take 1 tablet (120 mg total) by mouth 3 (three) times daily with meals. What changed:    medication strength  how much to take Changed by:  Scarlette ShortsIbtehal J Brennah Quraishi, MD   pioglitazone 30 MG tablet Commonly known as:  ACTOS Take 1 tablet (30 mg total) by mouth daily. Started  by:  Scarlette Shorts, MD        OBJECTIVE:   Vital Signs: BP (!) 132/92 (BP Location: Left Arm, Patient Position: Sitting, Cuff Size: Normal)   Pulse 74   Temp 98.1 F (36.7 C)   Ht 6\' 2"  (1.88 m)   Wt 183 lb 9.6 oz (83.3 kg)   SpO2 97%   BMI 23.57 kg/m   Wt Readings from Last 3 Encounters:  08/16/18 183 lb 9.6 oz (83.3 kg)  05/17/18 186 lb (84.4 kg)  02/22/18 178 lb (80.7 kg)     Exam: General: Pt appears well and is in NAD  Lungs: Clear with good BS bilat with no rales, rhonchi, or wheezes  Heart: RRR with  normal S1 and S2 and no gallops; no murmurs; no rub  Abdomen: Normoactive bowel sounds, soft, nontender, without masses or organomegaly palpable  Extremities: No pretibial edema. No tremor.  Neuro: MS is good with appropriate affect, pt is alert and Ox3    DM foot exam:   05/17/2018 The skin of the feet is intact without sores or ulcerations.  Plantar callus formation has been noted The pedal pulses are 2+ on right and 2+ on left. The sensation is intact to a screening 5.07, 10 gram monofilament bilaterally     DATA REVIEWED:  Lab Results  Component Value Date   HGBA1C >14 08/16/2018   HGBA1C 15.5 (H) 05/17/2018   HGBA1C >15 02/22/2018   In-office BG 510 mg/dL  ASSESSMENT / PLAN / RECOMMENDATIONS:   1) Type 2 Diabetes Mellitus, poorly controlled, With neuropathic complications - Most recent A1c of <14.0 %. Goal A1c < 7.0 %.   Plan:  -Patient has refused glycemic agents since 2017, after a lot of discussion about diabetes complications and his bothersome neuropathy he had agreed to start oral glycemic agents in October 2019. -He continues to refuse glucose checks at home, patient refuses insulin or any injectable medications at this time.  -He has been compliant with Glyburide and nateglinide.  -His A1c continues to be very high%, again we discussed his dire need for insulin but he declines, pt would take anything but insulin.  -  - He is already maxed out on Glyburide but will increase Nateglinide to maximize his endogenous insulin production but may also increase his risk of hypoglycemia. Will also start actos, we discussed risk of weight gain and peipheral edema with actos. I explained to him that he is at risk for hypoglycemia with the above combinations and since we have no idea of his readings without a glucose checks, will have to depend on his symptoms to assess hypoglycemia, we reviewed symptoms of hypoglycemia with the patient.  - His stimulated C-Peptide is normal , which  indicates the presence of endogenous insulin production but not sufficient.    MEDICATIONS: Continue glyburide  10 mg BID with meals  Increase Nateglinide 120 mg TID with meals Start Actos 30 mg daily      EDUCATION / INSTRUCTIONS:  BG monitoring instructions: Patient is instructed to check his blood sugars 2 times a day,fasting and bedtime.  Call [facility] if: BG persistently < 70 or > 300. . I reviewed the Rule of 15 for the treatment of hypoglycemia in detail with the patient.    2. Lipid: Pt declines medications. Not to overwhelm him, we will hold off on this and focus on diabetes care at this time.    F/U in 3 months    Signed electronically by:  Abby Raelyn Mora, MD  [facility] Endocrinology  [facility] [address] [place], [place] [address] Phone: [phone] FAX: [phone]   CC: Patient, No Pcp Per No address on file Phone: None  Fax: None  Return to Endocrinology clinic as below: Future Appointments  Date Time Provider Department Center  11/15/2018  3:00 PM Dominie Benedick, Konrad Dolores, MD [facility] None

## 2018-08-16 NOTE — Patient Instructions (Signed)
-   Continue Glyburide TWO tablets Twice a day with meals.  - Increase Starlix 120 mg Three times daily with meal  - Actos (Pioglitazone ) 30 mg once a day    - If you start feeling shaky or jittery please drink half a cup of juice, and wait 15 minutes until you feel better and let me know what that happens.

## 2018-08-16 NOTE — Telephone Encounter (Signed)
Patient called re: patient wants to know if he can take  2 of the 60 mg (=120 mg) of nateglinide (STARLIX) 120 MG tablet 3 x day tablets until he runs out before getting a RX filled for 120 mg 3 x day of nateglinide (STARLIX) 120 MG tablet. Please call patient at ph# [phone] to advise.

## 2018-08-19 NOTE — Telephone Encounter (Signed)
Noted! Pt notified of update.  

## 2018-08-19 NOTE — Telephone Encounter (Signed)
Please advise 

## 2018-10-08 ENCOUNTER — Other Ambulatory Visit: Payer: Self-pay | Admitting: Internal Medicine

## 2018-11-05 ENCOUNTER — Other Ambulatory Visit: Payer: Self-pay

## 2018-11-05 MED ORDER — GLYBURIDE 5 MG PO TABS: 10.0000 mg | ORAL_TABLET | Freq: Two times a day (BID) | ORAL | 0 refills | Status: DC

## 2018-11-13 ENCOUNTER — Other Ambulatory Visit: Payer: Self-pay

## 2018-11-15 ENCOUNTER — Other Ambulatory Visit: Payer: Self-pay

## 2018-11-15 ENCOUNTER — Ambulatory Visit: Payer: Self-pay | Admitting: Internal Medicine

## 2018-11-15 VITALS — BP 132/82 | HR 78 | Temp 98.6°F | Ht 74.0 in | Wt 184.6 lb

## 2018-11-15 DIAGNOSIS — E1142 Type 2 diabetes mellitus with diabetic polyneuropathy: Secondary | ICD-10-CM

## 2018-11-15 DIAGNOSIS — E1165 Type 2 diabetes mellitus with hyperglycemia: Secondary | ICD-10-CM

## 2018-11-15 LAB — POCT GLYCOSYLATED HEMOGLOBIN (HGB A1C): HbA1c POC (<> result, manual entry): >15 % (ref 4.0–5.6)

## 2018-11-15 LAB — GLUCOSE, POCT (MANUAL RESULT ENTRY): POC Glucose: 466 mg/dl — AB (ref 70–99)

## 2018-11-15 MED ORDER — GLYBURIDE 5 MG PO TABS: 10.0000 mg | ORAL_TABLET | Freq: Two times a day (BID) | ORAL | 3 refills | Status: DC

## 2018-11-15 MED ORDER — PIOGLITAZONE HCL 30 MG PO TABS: 30.0000 mg | ORAL_TABLET | Freq: Every day | ORAL | 6 refills | Status: DC

## 2018-11-15 MED ORDER — NATEGLINIDE 120 MG PO TABS: 120.0000 mg | ORAL_TABLET | Freq: Three times a day (TID) | ORAL | 6 refills | Status: DC

## 2018-11-15 NOTE — Patient Instructions (Signed)
-   Glyburide TWO tablets Twice a day with meals.  - Starlix (Nateglinide) 120 mg Three times daily with meals  - Actos (Pioglitazone ) 30 mg once a day - Start with Half a tablet once a day for 2 weeks, then increase to one full tablet once a day    - If you start feeling shaky or jittery please drink half a cup of juice, and wait 15 minutes until you feel better and let me know when that happens.

## 2018-11-15 NOTE — Progress Notes (Signed)
Name: Brandon [name]  Age/ Sex: 58 y.o., male   MRN/ DOB: [id number], 1961/01/24     PCP: Patient, No Pcp Per   Reason for Endocrinology Evaluation: Type 2 Diabetes Mellitus  Initial Endocrine Consultative Visit: 01/21/18    PATIENT IDENTIFIER: Brandon [name] is a 58 y.o. male with a past medical history of spinal stenosis and T2DM. The patient has followed with Endocrinology clinic since 01/21/18 for consultative assistance with management of his diabetes.  DIABETIC HISTORY:  Brandon [name] was diagnosed with T2DM in 2017, he had refused to accept his diagnosis and declined all medications in the past. In 2019, he went to [facility] and found to have a BG > 500 mg/dL and was referred here. His hemoglobin A1c has ranged from 13.9% in 2018, peaking at 15.3% in 2019.  Glyburide was started in 01/2018, Nateglinide was started in 05/2018 He continues to refuse to check his glucose.    Actos started 08/2018  SUBJECTIVE:   During the last visit (08/16/2018): A1c was 14%. Continued Glyburide , increased nateglinide and added actos   Today (11/15/2018): Brandon [name] is here for a 3 month follow up no his diabetes. He refuses to check his blood glucose at home.  For years he has declined to start on any glycemic agents, but he finally agreed to oral glycemic agents in October 2019.  The patient has not had hypoglycemic episodes since the last clinic visit.Otherwise, the patient has not required any recent emergency interventions for hypoglycemia and has not had recent hospitalizations secondary to hyper or hypoglycemic episodes.    ROS: As per HPI and as detailed below: Review of Systems  Constitutional: Negative for weight loss.  HENT: Negative for congestion and sore throat.   Respiratory: Negative for cough and shortness of breath.   Cardiovascular: Negative for chest pain and palpitations.  Genitourinary: Positive for frequency.  Musculoskeletal: Positive for joint  pain.  Neurological: Positive for tingling.  Endo/Heme/Allergies: Negative for polydipsia.      HOME DIABETES REGIMEN:  Glyburide 10 mg BID Nateglinide 120 mg TID QAC Actos 30 mg daily - did not start    METER DOWNLOAD SUMMARY: Does not check   HISTORY:  Past Medical History:  Past Medical History:  Diagnosis Date  . Diabetes mellitus without complication [facility])     Past Surgical History: No past surgical history on file.  Social History:  reports that he has never smoked. He has never used smokeless tobacco. He reports that he does not drink alcohol or use drugs. Family History:  Family History  Problem Relation Age of Onset  . Diabetes Mother   . Diabetes Maternal Grandmother      HOME MEDICATIONS: Allergies as of 11/15/2018   No Known Allergies     Medication List       Accurate as of November 15, 2018 11:44 AM. If you have any questions, ask your nurse or doctor.        glyBURIDE 5 MG tablet Commonly known as: DIABETA Take 2 tablets (10 mg total) by mouth 2 (two) times daily with a meal.   multivitamin with minerals tablet Take 1 tablet by mouth daily.   nateglinide 120 MG tablet Commonly known as: STARLIX Take 1 tablet (120 mg total) by mouth 3 (three) times daily with meals.   pioglitazone 30 MG tablet Commonly known as: ACTOS Take 1 tablet (30 mg total) by mouth daily.        OBJECTIVE:   Vital  Signs: There were no vitals taken for this visit.  Wt Readings from Last 3 Encounters:  08/16/18 183 lb 9.6 oz (83.3 kg)  05/17/18 186 lb (84.4 kg)  02/22/18 178 lb (80.7 kg)     Exam: General: Pt appears well and is in NAD  Lungs: Clear with good BS bilat with no rales, rhonchi, or wheezes  Heart: RRR with normal S1 and S2 and no gallops; no murmurs; no rub  Abdomen: Normoactive bowel sounds, soft, nontender, without masses or organomegaly palpable  Extremities: No pretibial edema. No tremor.  Neuro: MS is good with appropriate affect, pt is  alert and Ox3    DM foot exam:   05/17/2018 The skin of the feet is intact without sores or ulcerations.  Plantar callus formation has been noted The pedal pulses are 2+ on right and 2+ on left. The sensation is intact to a screening 5.07, 10 gram monofilament bilaterally     DATA REVIEWED:  Lab Results  Component Value Date   HGBA1C >14 08/16/2018   HGBA1C 15.5 (H) 05/17/2018   HGBA1C >15 02/22/2018   In-office BG 466 mg/dL  ASSESSMENT / PLAN / RECOMMENDATIONS:   1) Type 2 Diabetes Mellitus, poorly controlled, With neuropathic complications - Most recent A1c of >15.0 %. Goal A1c < 7.0 %.   Plan:  -Patient had refused glycemic agents since 2017, after a lot of discussion about diabetes complications and his bothersome neuropathy he had agreed to start oral glycemic agents in October 2019. -He continues to refuse glucose checks at home, patient refuses insulin or any injectable medications at this time.  -He has been compliant with Glyburide and nateglinide, for some reason he did not pick up Actos. We discussed side effects of weight gain , we also discussed old report with rosiglitazone of bladder cancer.  -His A1c continues to be very high A1c, we again we discussed his dire need for insulin but he declines, pt would take anything but insulin.  - I should him how tiny the pen needle is, he may be warming up to this.  - His stimulated C-Peptide is normal , which indicates the presence of endogenous insulin production but not sufficient.    MEDICATIONS: Continue glyburide  10 mg BID with meals Continue Nateglinide 120 mg TID with meals Start Actos 30 mg daily      EDUCATION / INSTRUCTIONS:  BG monitoring instructions: Patient is instructed to check his blood sugars 2 times a day,fasting and bedtime.  Call [place] [facility] if: BG persistently < 70 or > 300. . I reviewed the Rule of 15 for the treatment of hypoglycemia in detail with the patient.     F/U  in 3 months    Signed electronically by: Lyndle HerrlichAbby [name] Tim Corriher, MD  [facility]  [facility] [address] [place], [place] [address] Phone: [phone] FAX: [phone]   CC: Patient, No Pcp Per No address on file Phone: None  Fax: None  Return to Endocrinology clinic as below: Future Appointments  Date Time Provider Department Center  11/15/2018  3:00 PM Brandon [name], Brandon DoloresIbtehal Jaralla, MD [facility] None

## 2018-11-18 ENCOUNTER — Encounter: Payer: Self-pay | Admitting: Internal Medicine

## 2018-12-07 ENCOUNTER — Other Ambulatory Visit: Payer: Self-pay | Admitting: Internal Medicine

## 2019-01-10 ENCOUNTER — Other Ambulatory Visit: Payer: Self-pay

## 2019-01-10 ENCOUNTER — Ambulatory Visit: Payer: Self-pay | Admitting: Internal Medicine

## 2019-01-13 ENCOUNTER — Encounter: Payer: Self-pay | Admitting: Internal Medicine

## 2019-01-13 ENCOUNTER — Other Ambulatory Visit: Payer: Self-pay

## 2019-01-13 ENCOUNTER — Ambulatory Visit (INDEPENDENT_AMBULATORY_CARE_PROVIDER_SITE_OTHER): Payer: Self-pay | Admitting: Internal Medicine

## 2019-01-13 VITALS — BP 118/70 | HR 90 | Ht 74.0 in | Wt 184.6 lb

## 2019-01-13 DIAGNOSIS — E1165 Type 2 diabetes mellitus with hyperglycemia: Secondary | ICD-10-CM

## 2019-01-13 NOTE — Progress Notes (Signed)
Name: Brandon [name]  Age/ Sex: 58 y.o., male   MRN/ DOB: [id number], 07/20/1960     PCP: Patient, No Pcp Per   Reason for Endocrinology Evaluation: Type 2 Diabetes Mellitus  Initial Endocrine Consultative Visit: 01/21/18    PATIENT IDENTIFIER: Brandon [name] is a 58 y.o. male with a past medical history of spinal stenosis and T2DM. The patient has followed with Endocrinology clinic since 01/21/18 for consultative assistance with management of his diabetes.  DIABETIC HISTORY:  Brandon [name] was diagnosed with T2DM in 2017, he had refused to accept his diagnosis and declined all medications in the past. In 2019, [facility] and found to have a BG > 500 mg/dL and was referred here. His hemoglobin A1c has ranged from 13.9% in 2018, peaking at 15.3% in 2019.  Glyburide was started in 01/2018, Nateglinide was started in 05/2018 He continues to refuse to check his glucose.    Actos started 08/2018  SUBJECTIVE:   During the last visit (11/15/2018): A1c was < 15.0 %. Continued Glyburide , nateglinide and restarted actos   Today (01/14/2019): Brandon [name] is here for a 3 month follow up no his diabetes. He refuses to check his blood glucose at home.  For years he has declined to start on any glycemic agents, but he finally agreed to oral glycemic agents in October 2019.  The patient has not had hypoglycemic episodes since the last clinic visit.Otherwise, the patient has not required any recent emergency interventions for hypoglycemia and has not had recent hospitalizations secondary to hyper or hypoglycemic episodes.   Uses 2 scoops of protein powder, he is not sure how much CHO does it contain     ROS: As per HPI and as detailed below: Review of Systems  Constitutional: Negative for weight loss.  HENT: Negative for congestion and sore throat.   Respiratory: Negative for cough and shortness of breath.   Cardiovascular: Negative for chest pain and  palpitations.  Genitourinary: Positive for frequency.  Musculoskeletal: Negative for joint pain.  Neurological: Positive for tingling.  Endo/Heme/Allergies: Negative for polydipsia.      HOME DIABETES REGIMEN:  Glyburide 10 mg BID Nateglinide 120 mg TID QAC Actos 30 mg daily - does not want to take it    METER DOWNLOAD SUMMARY: Does not check   HISTORY:  Past Medical History:  Past Medical History:  Diagnosis Date  . Diabetes mellitus without complication [facility](HCC)     Past Surgical History: History reviewed. No pertinent surgical history.  Social History:  reports that he has never smoked. He has never used smokeless tobacco. He reports that he does not drink alcohol or use drugs. Family History:  Family History  Problem Relation Age of Onset  . Diabetes Mother   . Diabetes Maternal Grandmother      HOME MEDICATIONS: Allergies as of 01/13/2019   No Known Allergies     Medication List       Accurate as of January 13, 2019 11:59 PM. If you have any questions, ask your nurse or doctor.        STOP taking these medications   pioglitazone 30 MG tablet Commonly known as: ACTOS Stopped by: Scarlette ShortsIbtehal J Lorena Clearman, MD     TAKE these medications   glyBURIDE 5 MG tablet Commonly known as: DIABETA TAKE 2 TABLETS(10 MG) BY MOUTH TWICE DAILY WITH A MEAL   multivitamin with minerals tablet Take 1 tablet by mouth daily.   nateglinide 120 MG tablet  Commonly known as: STARLIX Take 1 tablet (120 mg total) by mouth 3 (three) times daily with meals.        OBJECTIVE:   Vital Signs: BP 118/70 (BP Location: Left Arm, Patient Position: Sitting, Cuff Size: Normal)   Pulse 90   Ht 6\' 2"  (1.88 m)   Wt 184 lb 9.6 oz (83.7 kg)   SpO2 96%   BMI 23.70 kg/m   Wt Readings from Last 3 Encounters:  01/13/19 184 lb 9.6 oz (83.7 kg)  11/15/18 184 lb 9.6 oz (83.7 kg)  08/16/18 183 lb 9.6 oz (83.3 kg)     Exam: General: Pt appears well and is in NAD  Lungs: Clear with good  BS bilat with no rales, rhonchi, or wheezes  Heart: RRR with normal S1 and S2 and no gallops; no murmurs; no rub  Abdomen: Normoactive bowel sounds, soft, nontender, without masses or organomegaly palpable  Extremities: No pretibial edema. No tremor.  Neuro: MS is good with appropriate affect, pt is alert and Ox3    DM foot exam:   05/17/2018 The skin of the feet is intact without sores or ulcerations.  Plantar callus formation has been noted The pedal pulses are 2+ on right and 2+ on left. The sensation is intact to a screening 5.07, 10 gram monofilament bilaterally     DATA REVIEWED:  Lab Results  Component Value Date   HGBA1C >15 11/15/2018   HGBA1C >14 08/16/2018   HGBA1C 15.5 (H) 05/17/2018   Results for DAWSON, ALBERS (MRN [id number]) as of 01/14/2019 08:43  Ref. Range 05/17/2018 15:50  Creatinine,U Latest Units: mg/dL 56.7  Microalb, Ur Latest Ref Range: 0.0 - 1.9 mg/dL <0.7  MICROALB/CREAT RATIO Latest Ref Range: 0.0 - 30.0 mg/g 1.2   ASSESSMENT / PLAN / RECOMMENDATIONS:   1) Type 2 Diabetes Mellitus, Poorly controlled, With neuropathic complications - Most recent A1c of >15.0 %. Goal A1c < 7.0 %.   Plan:  -Patient had refused glycemic agents since 2017, after a lot of discussion about diabetes complications and his bothersome neuropathy he had agreed to start oral glycemic agents in October 2019. -He continues to refuse glucose checks at home, patient refuses insulin or any injectable medications at this time. ON his last visit he was shown insulin pens to warm him to the idea but he continues to decline.   -He has been compliant with Glyburide and nateglinide,but declined to use actos due to side effects. - We again discussed the complications of diabetes in causing retinopathy which would lead to blindness, nephropathy which could lead to HD and neuropathy which he already suffers from.  - He also lacks insurance, I have provided him with the information to establish  at the [facility] health community clinic to get financial assistance but he has not done that .  - Today he is asking about metformin, GLp-1 agonists and SGLT-2 inhibitors, again I explained to him I will be happy to write him a prescription but I doubt that they will reduce his glucose to where they need to be.  - After a long discussion and back and forth over the phone on 01/14/19, pt agreed to try insulin . Will start patient assistance program but in the meantime he will obtain ReliOn insulin from [place].      MEDICATIONS: Stop glyburide  Stop Nateglinide Start Relion Novolin Mix 18 units with Breakfast and Supper      EDUCATION / INSTRUCTIONS:  BG monitoring instructions: Patient is instructed to  check his blood sugars 2 times a day,fasting and bedtime.  Call [facility] if: BG persistently < 70 or > 300. . I reviewed the Rule of 15 for the treatment of hypoglycemia in detail with the patient.    2.) Dyslipidemia :  - Discussed his extremely elevated LDL, pt agreed to start a statin. Discussed arthralgia and myalgia as a side effect.  - Lipitor 10 mg QHS   F/U in 4 months    Signed electronically by: Lyndle Herrlich, MD  [facility] Endocrinology  [facility] [address] [place] [place], [place] [address] Phone: [phone] FAX: [phone]   CC: Patient, No Pcp Per No address on file Phone: None  Fax: None  Return to Endocrinology clinic as below: Future Appointments  Date Time Provider Department Center  05/23/2019  3:00 PM Joey Lierman, Konrad Dolores, MD [facility] None

## 2019-01-14 ENCOUNTER — Telehealth: Payer: Self-pay | Admitting: Internal Medicine

## 2019-01-14 LAB — CBC WITH DIFFERENTIAL/PLATELET
Basophils Absolute: 0.1 10*3/uL (ref 0.0–0.1)
Basophils Relative: 1.5 % (ref 0.0–3.0)
Eosinophils Absolute: 0 10*3/uL (ref 0.0–0.7)
Eosinophils Relative: 0.7 % (ref 0.0–5.0)
HCT: 46 % (ref 39.0–52.0)
Hemoglobin: 15.3 g/dL (ref 13.0–17.0)
Lymphocytes Relative: 36.8 % (ref 12.0–46.0)
Lymphs Abs: 1.7 10*3/uL (ref 0.7–4.0)
MCHC: 33.2 g/dL (ref 30.0–36.0)
MCV: 86.2 fl (ref 78.0–100.0)
Monocytes Absolute: 0.2 10*3/uL (ref 0.1–1.0)
Monocytes Relative: 5.3 % (ref 3.0–12.0)
Neutro Abs: 2.6 10*3/uL (ref 1.4–7.7)
Neutrophils Relative %: 55.7 % (ref 43.0–77.0)
Platelets: 196 10*3/uL (ref 150.0–400.0)
RBC: 5.33 Mil/uL (ref 4.22–5.81)
RDW: 13.1 % (ref 11.5–15.5)
WBC: 4.6 10*3/uL (ref 4.0–10.5)

## 2019-01-14 LAB — BASIC METABOLIC PANEL
BUN: 25 mg/dL — ABNORMAL HIGH (ref 6–23)
CO2: 27 mEq/L (ref 19–32)
Calcium: 10 mg/dL (ref 8.4–10.5)
Chloride: 97 mEq/L (ref 96–112)
Creatinine, Ser: 1.25 mg/dL (ref 0.40–1.50)
GFR: 72.14 mL/min (ref >60.00)
Glucose, Bld: 381 mg/dL — ABNORMAL HIGH (ref 70–99)
Potassium: 4.8 mEq/L (ref 3.5–5.1)
Sodium: 133 mEq/L — ABNORMAL LOW (ref 135–145)

## 2019-01-14 LAB — LIPID PANEL
Cholesterol: 264 mg/dL — ABNORMAL HIGH (ref 0–200)
HDL: 49.5 mg/dL (ref >39.00)
NonHDL: 214.12
Total CHOL/HDL Ratio: 5
Triglycerides: 211 mg/dL — ABNORMAL HIGH (ref 0.0–149.0)
VLDL: 42.2 mg/dL — ABNORMAL HIGH (ref 0.0–40.0)

## 2019-01-14 LAB — HEMOGLOBIN A1C: Hgb A1c MFr Bld: 16.6 % — ABNORMAL HIGH (ref 4.6–6.5)

## 2019-01-14 LAB — LDL CHOLESTEROL, DIRECT: Direct LDL: 199 mg/dL

## 2019-01-14 MED ORDER — NOVOLIN 70/30 FLEXPEN RELION (70-30) 100 UNIT/ML ~~LOC~~ SUPN: 20.0000 [IU] | PEN_INJECTOR | Freq: Two times a day (BID) | SUBCUTANEOUS | 3 refills | Status: AC

## 2019-01-14 MED ORDER — RELION PEN NEEDLES 32G X 4 MM MISC: 1.0000 | Freq: Two times a day (BID) | 3 refills | Status: AC

## 2019-01-14 MED ORDER — ATORVASTATIN CALCIUM 10 MG PO TABS: 10.0000 mg | ORAL_TABLET | Freq: Every day | ORAL | 3 refills | Status: DC

## 2019-01-15 ENCOUNTER — Ambulatory Visit: Payer: Self-pay

## 2019-01-15 NOTE — Telephone Encounter (Signed)
lft pt vm informing him of this

## 2019-01-15 NOTE — Telephone Encounter (Signed)
Pt called and stated that he has changed his mind about being placed on an insulin and would like call back to discuss going back on previous medications.

## 2019-01-15 NOTE — Patient Instructions (Signed)
-   STOP Glyburide   - STOP Starlix - Start ReliOn Insulin Mix at 18 unit with Breakfast and 18 units with Supper    - If you start feeling shaky or jittery please drink half a cup of juice, and wait 15 minutes until you feel better and let me know when that happens.

## 2019-05-23 ENCOUNTER — Ambulatory Visit: Payer: Self-pay | Admitting: Internal Medicine

## 2019-05-23 DIAGNOSIS — Z0289 Encounter for other administrative examinations: Secondary | ICD-10-CM

## 2019-05-23 NOTE — Progress Notes (Deleted)
Name: Brandon [name]  Age/ Sex: 59 y.o., male   MRN/ DOB: [id number], March 23, 1961     PCP: Patient, No Pcp Per   Reason for Endocrinology Evaluation: Type 2 Diabetes Mellitus  Initial Endocrine Consultative Visit: 01/21/18    PATIENT IDENTIFIER: Brandon [name] is a 59 y.o. male with a past medical history of spinal stenosis and T2DM. The patient has followed with Endocrinology clinic since 01/21/18 for consultative assistance with management of his diabetes.  DIABETIC HISTORY:  Brandon [name] was diagnosed with T2DM in 2017, he had refused to accept his diagnosis and declined all medications in the past. In 2019, he went to [facility] and found to have a BG > 500 mg/dL and was referred here. His hemoglobin A1c has ranged from 13.9% in 2018, peaking at 15.3% in 2019.  Glyburide was started in 01/2018, Nateglinide was started in 05/2018 He continues to refuse to check his glucose.    Actos started 08/2018  SUBJECTIVE:   During the last visit (11/15/2018): A1c was < 15.0 %. Continued Glyburide , nateglinide and restarted actos   Today (05/23/2019): Brandon [name] is here for a 3 month follow up no his diabetes. He refuses to check his blood glucose at home.  For years he has declined to start on any glycemic agents, but he finally agreed to oral glycemic agents in October 2019.  The patient has not had hypoglycemic episodes since the last clinic visit.Otherwise, the patient has not required any recent emergency interventions for hypoglycemia and has not had recent hospitalizations secondary to hyper or hypoglycemic episodes.   Uses 2 scoops of protein powder, he is not sure how much CHO does it contain     ROS: As per HPI and as detailed below: Review of Systems  Constitutional: Negative for weight loss.  HENT: Negative for congestion and sore throat.   Respiratory: Negative for cough and shortness of breath.   Cardiovascular: Negative for chest pain and  palpitations.  Genitourinary: Positive for frequency.  Musculoskeletal: Negative for joint pain.  Neurological: Positive for tingling.  Endo/Heme/Allergies: Negative for polydipsia.      HOME DIABETES REGIMEN:  Glyburide 10 mg BID Nateglinide 120 mg TID QAC Actos 30 mg daily - does not want to take it    METER DOWNLOAD SUMMARY: Does not check   HISTORY:  Past Medical History:  Past Medical History:  Diagnosis Date  . Diabetes mellitus without complication [facility])     Past Surgical History: No past surgical history on file.  Social History:  reports that he has never smoked. He has never used smokeless tobacco. He reports that he does not drink alcohol or use drugs. Family History:  Family History  Problem Relation Age of Onset  . Diabetes Mother   . Diabetes Maternal Grandmother      HOME MEDICATIONS: Allergies as of 05/23/2019   No Known Allergies     Medication List       Accurate as of May 23, 2019  1:34 PM. If you have any questions, ask your nurse or doctor.        atorvastatin 10 MG tablet Commonly known as: LIPITOR Take 1 tablet (10 mg total) by mouth at bedtime.   multivitamin with minerals tablet Take 1 tablet by mouth daily.   NovoLIN 70/30 FlexPen Relion (70-30) 100 UNIT/ML PEN Generic drug: Insulin Isophane & Regular Human Inject 20 Units into the skin 2 (two) times daily with a meal.   ReliOn Pen Needles  32G X 4 MM Misc Generic drug: Insulin Pen Needle 1 Device by Does not apply route 2 (two) times daily.        OBJECTIVE:   Vital Signs: There were no vitals taken for this visit.  Wt Readings from Last 3 Encounters:  01/13/19 184 lb 9.6 oz (83.7 kg)  11/15/18 184 lb 9.6 oz (83.7 kg)  08/16/18 183 lb 9.6 oz (83.3 kg)     Exam: General: Pt appears well and is in NAD  Lungs: Clear with good BS bilat with no rales, rhonchi, or wheezes  Heart: RRR with normal S1 and S2 and no gallops; no murmurs; no rub  Abdomen: Normoactive  bowel sounds, soft, nontender, without masses or organomegaly palpable  Extremities: No pretibial edema. No tremor.  Neuro: MS is good with appropriate affect, pt is alert and Ox3    DM foot exam:   05/17/2018 The skin of the feet is intact without sores or ulcerations.  Plantar callus formation has been noted The pedal pulses are 2+ on right and 2+ on left. The sensation is intact to a screening 5.07, 10 gram monofilament bilaterally     DATA REVIEWED:  Lab Results  Component Value Date   HGBA1C 16.6 (H) 01/13/2019   HGBA1C >15 11/15/2018   HGBA1C >14 08/16/2018   Results for Brandon, [name] (MRN [id number]) as of 01/14/2019 08:43  Ref. Range 05/17/2018 15:50  Creatinine,U Latest Units: mg/dL 56.7  Microalb, Ur Latest Ref Range: 0.0 - 1.9 mg/dL <0.7  MICROALB/CREAT RATIO Latest Ref Range: 0.0 - 30.0 mg/g 1.2   ASSESSMENT / PLAN / RECOMMENDATIONS:   1) Type 2 Diabetes Mellitus, Poorly controlled, With neuropathic complications - Most recent A1c of >15.0 %. Goal A1c < 7.0 %.   Plan:  -Patient had refused glycemic agents since 2017, after a lot of discussion about diabetes complications and his bothersome neuropathy he had agreed to start oral glycemic agents in October 2019. -He continues to refuse glucose checks at home, patient refuses insulin or any injectable medications at this time. ON his last visit he was shown insulin pens to warm him to the idea but he continues to decline.   -He has been compliant with Glyburide and nateglinide,but declined to use actos due to side effects. - We again discussed the complications of diabetes in causing retinopathy which would lead to blindness, nephropathy which could lead to HD and neuropathy which he already suffers from.  - He also lacks insurance, I have provided him with the information to establish at the [facility] health community clinic to get financial assistance but he has not done that .  - Today he is asking about metformin, GLp-1  agonists and SGLT-2 inhibitors, again I explained to him I will be happy to write him a prescription but I doubt that they will reduce his glucose to where they need to be.  - After a long discussion and back and forth over the phone on 01/14/19, pt agreed to try insulin . Will start patient assistance program but in the meantime he will obtain ReliOn insulin from [place].      MEDICATIONS: Stop glyburide  Stop Nateglinide Start Relion Novolin Mix 18 units with Breakfast and Supper      EDUCATION / INSTRUCTIONS:  BG monitoring instructions: Patient is instructed to check his blood sugars 2 times a day,fasting and bedtime.  Call [facility] if: BG persistently < 70 or > 300. . I reviewed the Rule of 15 for  the treatment of hypoglycemia in detail with the patient.    2.) Dyslipidemia :  - Discussed his extremely elevated LDL, pt agreed to start a statin. Discussed arthralgia and myalgia as a side effect.  - Lipitor 10 mg QHS   F/U in 4 months    Signed electronically by: Lyndle Herrlich, MD  [facility] Endocrinology  [facility] [address] [place] [place], [place] [address] Phone: [phone] FAX: [phone]   CC: Patient, No Pcp Per No address on file Phone: None  Fax: None  Return to Endocrinology clinic as below: Future Appointments  Date Time Provider Department Center  05/23/2019  3:00 PM Gianni Fuchs, Konrad Dolores, MD [facility] None

## 2023-10-24 DIAGNOSIS — N1330 Unspecified hydronephrosis: Secondary | ICD-10-CM | POA: Insufficient documentation

## 2023-10-24 DIAGNOSIS — N138 Other obstructive and reflux uropathy: Secondary | ICD-10-CM | POA: Insufficient documentation

## 2023-10-30 DIAGNOSIS — C61 Malignant neoplasm of prostate: Secondary | ICD-10-CM | POA: Insufficient documentation

## 2023-11-20 DIAGNOSIS — E119 Type 2 diabetes mellitus without complications: Secondary | ICD-10-CM | POA: Insufficient documentation

## 2023-11-22 ENCOUNTER — Other Ambulatory Visit: Payer: Self-pay | Admitting: Radiation Oncology

## 2023-11-22 ENCOUNTER — Inpatient Hospital Stay
Admission: RE | Admit: 2023-11-22 | Discharge: 2023-11-22 | Disposition: A | Discharge status: Home / Self Care | Payer: Self-pay | Source: Ambulatory Visit | Attending: Radiation Oncology | Admitting: Radiation Oncology

## 2023-11-22 ENCOUNTER — Telehealth: Payer: Self-pay | Admitting: Radiation Oncology

## 2023-11-22 DIAGNOSIS — C61 Malignant neoplasm of prostate: Secondary | ICD-10-CM

## 2023-11-22 NOTE — Telephone Encounter (Signed)
 8/21 @ 1:55 am Called both patient contact numbers left voicemail for patient to call our office to be schedule for consult.

## 2023-11-22 NOTE — Telephone Encounter (Signed)
 8/21 sent via stat fax requested to recent images to be pushed to powershare from [facility].

## 2023-11-23 ENCOUNTER — Telehealth: Payer: Self-pay | Admitting: Radiation Oncology

## 2023-11-23 NOTE — Telephone Encounter (Signed)
 8/22 @ 8:23 am Call patient contact numbers/left voicemail for patient to call our office to be sch for consult.

## 2023-11-23 NOTE — Telephone Encounter (Signed)
 8/22 Left voicemail for canopy to upload recent CT/US  images to PACs in epic.  Waiting on images.

## 2023-11-28 NOTE — Progress Notes (Signed)
 [facility] Department of Urology Colton H. Vannie, MD  Patient Name: Brandon [name]  Referred by: No ref. provider found  PCP: Richerd Ship, MD  Date of Visit: 11/28/2023  Chief Complaint(s): Metastatic Prostate Cancer  HISTORY OF PRESENT ILLNESS Brandon [name] is a 63 y.o. male, who presents for the following:  PROBLEM 1 Diagnosis:  High-volume metastatic hormone-sensitive prostate cancer Date of diagnosis: 11/01/23 - 10/25/23 PSA: 106.78 - 11/01/23 TRUS Prostate Biopsy > 3/6 sites (+) for GG4, 3/6 sites (+) for GG3 - 11/15/23 PSMA PET/CT: Diffuse radiotracer uptake in the axial and appendicular skeletons Prior treatment(s): Bicalutamide (11/07/23 - present) Current symptoms: Abdominal pain, anorexia, FTT, unintentional weight loss (>40 lbs) Functional status: 2 - Ambulatory and capable of self-care, unable to work Pertinent history: Was admitted on 8/14-8/21/25 for ARF and anemia of chronic disease requiring bilateral PCN placement and 2 U pRBC, respectively. Latest updates: Is scheduled to see Rad Onc Archie [facility]ge at [facility]) on 12/10/23, but does not appear to have yet been referred to Med Onc.  PROBLEM 2 Diagnosis: Bilateral malignant ureteral obstruction Date of diagnosis: 10/02/23 - 10/16/23 CT Urogram: Severe bilateral hydoureteronephosis to the level of the UVJ due to local invasion of prostate cancer, including the SVs, posterior bladder wall, and mesorectum. Pelvic and retroperitoneal lymphadenopathy concerning for nodal metastatic disease. Multiple sclerotic lesions of the axial and appendicular skeleton. - 11/15/23 NCCT A/P: Severe bilateral hydroureteronephrosis to the UVJ with evidence of BOO and bladder wall thickening. Two right retroperitoneal fluid collections, measuring 5.8 x 3.5 cm and 3.6 x 4.7 cm, respectively, concerning for urinomas. Multiple sclerotic lesions of the axial and appendicular skeleton. Prior treatment(s):  Bilateral PCN placement on 11/16/23 Current symptoms: Previously experienced bilateral mid-back pain, now resolved Functional status: 2 - Ambulatory and capable of self-care, unable to work Pertinent history: Was admitted on 8/14-8/21/25 for ARF and anemia of chronic disease requiring bilateral PCN placement and 2 U pRBC, respectively. Latest updates: Is scheduled to see VIR on 02/08/24 for routine bilateral PCN exchange, but does not appear to have yet been referred to Nephrology.  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  PAST MEDICAL, SURGICAL, FAMILY, AND SOCIAL HISTORY Medical: Medical History[1] Surgical: Surgical History[2] Family: Family History[3] Social: Social History[4] Allergies: Allergies[5] Medications: Prior to Admission medications  Medication  atorvastatin  (LIPITOR) 20 mg tablet  bicalutamide (CASODEX) 50 mg tablet  BisaCODYL (DULCOLAX) 10 mg suppository  gabapentin 100 mg tab  glucose blood test strip  glucose monitoring kit kit  insulin  glargine (Basaglar KwikPen U-100 Insulin ) 100 unit/mL (3 mL) pen  insulin  lispro (HumaLOG KwikPen) 100 unit/mL KwikPen  lancets 30 gauge misc  pen needle, diabetic 31 gauge x 5/16 ndle  polyethylene glycol (MIRALAX) 17 gram powd powder  senna (SENOKOT) 8.6 mg tablet  tamsulosin (FLOMAX) 0.4 mg cap    VITALS AND  BMI Vitals:   11/28/23 1048  BP: 152/81  Pulse: 100  Temp: 98.4 F (36.9 C)  SpO2: 100%   Body mass index is 17.97 kg/m.  PHYSICAL EXAM General: NAD, chronically ill-appearing Abdomen: No abdominal tenderness Genitourinary: No CVA, flank, or suprapubic tenderness Surgical Site(s): No surgical incisions/scars Lines/Drains: Bilateral PCNs in place to DD draining clear yellow urine  LABS, IMAGING, AND PATHOLOGY H/H: Lab Results  Component Value Date   WBC 6.60 11/22/2023   HGB 8.2 (L) 11/22/2023   HCT 24.5 (L) 11/22/2023   PLT 484 (H) 11/22/2023   Cr/eGFR: Lab Results  Component Value Date   CREATININE 3.32 (H) 11/22/2023   EGFR 20 (L) 11/22/2023   HbA1c: Lab Results  Component Value Date   HGBA1C 14.2 (H) 11/16/2023   PSA: Lab Results  Component Value Date   PSA 106.78 (H) 10/25/2023   UA: Lab Results  Component Value Date   COLORU Red (A) 11/15/2023   CLARITYU Ex. Turbid (A) 11/15/2023   SPECGRAV 1.012 11/15/2023   PHUR 6.5 11/15/2023   PROTEINUA 100 (A) 11/15/2023   GLUCOSEU 1000 (A) 11/15/2023   KETONEU Negative 11/15/2023   BILIRUBINUR Negative 11/15/2023   BLOODU 3+ (A) 11/15/2023   UROBILINOGEN 2.0 (A) 11/15/2023   LEUKOUA 75 (A) 11/15/2023   RBCU >20 (A) 11/15/2023   WBCU >50 (A) 11/15/2023   BACTERIA None Seen 11/15/2023   Culture: No results found for the last 90 days.  Cytology: None  Pathology: Lab Results  Component Value Date   FINALDX  11/01/2023    A. PROSTATE, LEFT APEX, CORE BIOPSY:  Prostatic adenocarcinoma, Grade group 4 (Gleason score 4+4=8), involving approximately 10% of the prostatic tissue. Intraductal carcinoma present.   B. PROSTATE, LEFT MID, CORE BIOPSY:   Prostatic adenocarcinoma, Grade group 4 (Gleason score 4+4=8),  involving approximately 25% of the prostatic tissue; Cribriform glands present. Atypical intraductal proliferation.   C. PROSTATE, LEFT BASE, CORE BIOPSY:   Prostatic adenocarcinoma, Grade  group 4 (Gleason score 4+4=8),  involving 2 of 2 cores and 60-70% of the prostatic tissue; Cribriform glands present. Intraductal carcinoma present.  Perineural invasion identified.  D. PROSTATE, RIGHT APEX, CORE BIOPSY:   Prostatic adenocarcinoma, Grade group 3 (Gleason score 4+3=7), involving 2 of 2 cores and 15% of the prostatic tissue.  E. PROSTATE, RIGHT MID, CORE BIOPSY:   Prostatic adenocarcinoma, Grade group 3 (Gleason grade 4+3=7),  involving 2 of 2 cores and 10-15% of the prostatic tissue.  Intraductal carcinoma present. Perineural invasion identified.  F. PROSTATE, RIGHT BASE, CORE BIOPSY:    Prostatic adenocarcinoma, Grade group 3 (Gleason score 4+3=7), involving 2 of 2 cores and 20-25% of the prostatic tissue.  Intraductal carcinoma present.  Perineural invasion identified.      Imaging: I personally reviewed all relevant labs and imaging, integrating their findings into my clinical assessment. My interpretations and their impact on decision-making are included in the HPI above and/or A&P below.  Radiology Results (last 30 days)     Procedure Component Value Units Date/Time   XR Abdomen 1 View [[id number]] Collected: 11/22/23 1557   Order Status: Completed Updated: 11/22/23 1650   Narrative:     XR ABDOMEN 1 VIEW, 11/22/2023 3:33 PM   INDICATION: constipation  COMPARISON: CT abdomen 11/15/2023  TECHNIQUE: Supine radiograph(s) of the abdomen were obtained.  FINDINGS:  . Gastrointestinal tract: Nonobstructive bowel gas pattern. Moderate stool burden overlying rectum. . Calcifications: Vascular calcifications. . Musculoskeletal: No acute bony abnormality. . Lower chest: No acute process. . Lines/Tubes/Surgical: Bilateral percutaneous nephrostomy tubes in place. . Additional comments: None.    Impression:  Nonobstructive bowel gas pattern.      IR Nephrostomy Tube Placement BILATERAL [[id number]] Collected: 11/16/23 1651   Order Status: Completed  Updated: 11/16/23 1705   Narrative:     IR NEPHROSTOMY TUBE PLACEMENT BILATERAL, 11/16/2023 11:12 AM  INDICATION & HISTORY: 63 year old man with prostate cancer and bilateral severe hydronephrosis from urinary obstruction. Noted on preprocedural CT imaging were significant blood products in the bilateral renal collecting systems.  ATTENDING PHYSICIAN: Philippe Cava, MD  RESIDENT PHYSICIAN: None.  APP: None.  PROCEDURES: 1.  Local diagnostic ultrasound of bilateral kidneys. 2.  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 3.  Bilateral percutaneous nephrostomy tube placement.  FINDINGS: 1.  Severe bilateral hydronephrosis with blood clots within the bilateral renal collecting systems. 2.  Large right perinephric urinoma. Aspiration of the urinoma returned dark bloody urine. 3.  The post-placement nephrostogram demonstrates appropriate positioning of the catheter loop within the renal pelvis. There were significant filling defects within the bilateral renal collecting systems consistent with the blood products/clots seen on preprocedural CT and ultrasound imaging. Return of dark red bloody urine from both nephrostomy tubes.     Impression:     CONCLUSION: Successful diagnostic ultrasound and placement of 8 Fr percutaneous nephrostomy tubes into the bilateral kidney. Complete decompression and aspiration of a right perinephric urinoma prior to right percutaneous nephrostomy tube placement.   PLAN: The patient will return to IR in 12 weeks for routine catheter exchange.    DETAILS OF PROCEDURE:  Informed consent was obtained after a discussion of the risks, benefits, and alternatives. The patient was laid prone on the table and prepped and draped in the usual sterile fashion. Maximum sterile barriers were used, including cap, mask, hand hygiene, sterile gloves, sterile gown, large sterile drape, and 2%  chlorhexidine for cutaneous antisepsis. A standardized time-out was then performed in accordance with the Universal Protocol guidelines to confirm the correct patient, operative site, and procedure.  The patient's left flank was evaluated with ultrasound, and an access site was chosen below the 12th rib overlying a posterior calyx of the left kidney. A representative image was stored. The overlying skin was anesthetized with 1% lidocaine. Initial ultrasound demonstrated clots and blood products within the left renal pelvis. Under direct ultrasound guidance, a 21-gauge InRad needle was used to access the calyx. The location of the needle tip was confirmed by aspiration of urine and contrast injection under fluoroscopy.  A guidewire was advanced through the needle into the left collecting system. The needle was exchanged over the guidewire, and the tract was dilated to ultimately accommodate placement of a 8 Fr Flexima catheter. The retaining loop was formed within the renal pelvis. Contrast injection of the catheter under fluoroscopy confirmed appropriate positioning. The catheter was secured to the skin with 2-0 Ethilon and connected to gravity drainage.   The patient's right flank was evaluated with ultrasound, and an access site was chosen below the 12th rib overlying a posterior calyx of the right kidney. A representative image was stored. Initial ultrasound demonstrated clots and blood products within the right renal pelvis, as well as a large perinephric urinoma. The overlying skin was anesthetized with 1% lidocaine. Using a 5 French centesis needle, the urinoma was accessed. Complete aspiration and decompression of the urinoma was performed through the centesis needle, yielding 90 mL of dark bloody urine. Under direct ultrasound guidance, a 21-gauge InRad needle was used to access a posterior midpole calyx. The location of the needle  tip was confirmed by aspiration of urine and contrast injection under  fluoroscopy.  A guidewire was advanced through the needle into the right collecting system. The needle was exchanged over the guidewire, and the tract was dilated to ultimately accommodate placement of a 8 Fr Flexima catheter. The retaining loop was formed within the renal pelvis. Contrast injection of the catheter under fluoroscopy confirmed appropriate positioning. The catheter was secured to the skin with 2-0 Ethilon and connected to gravity drainage.    COMPLICATIONS: No immediate complications. The patient tolerated the procedure well and left the interventional radiology suite in stable condition.  ESTIMATED BLOOD LOSS: Less than 10 mL.  CONTRAST USED: 60 mL Visipaque.  FLUOROSCOPY TIME / CUMULATIVE DOSE: 6.4 minutes / 85 mGy.   SEDATION: Moderate sedation.  A pre-procedure assessment included review of the history & physical and relevant diagnostic tests, ensuring that the patient was an appropriate candidate for moderate sedation. An independent, trained nurse Romero Rodney, RN) administered the IV medication required for sedation while monitoring continuous vital signs and cardiorespiratory function. This RN was present during the entire procedure under the supervision of the attending physician, Philippe Cava, MD. The patient returned to baseline mental status prior to transfer from the department. Clinical and physiologic documentation from this procedure is available within the electronic medical record. The patient's age, active monitoring times, and total medication dosages are as follows:  *  Patient age: 18 years *  Moderate sedation start time: 1008 *  Moderate sedation end time: 1105 *  Total intraservice face-to-face sedation time: 57 minutes  MEDICATIONS: Versed 1 mg IV. Fentanyl 100 mcg IV.   ANTIBIOTICS: See separate Nursing/Anesthesia documentation.    *Structured report code: IR.2.1   CT Abdomen Pelvis WO Contrast [[id number]] Collected: 11/15/23 1708   Order  Status: Completed Updated: 11/15/23 2124   Narrative:     CT ABDOMEN PELVIS WO CONTRAST, 11/15/2023 5:07 PM  INDICATION: Abdominal pain, acute, nonlocalized, R>L abdominal pain \   COMPARISON: Urogram 10/16/2023.  TECHNIQUE: CT images of the abdomen and pelvis were obtained without the use of intravenous contrast. Conventional axial reconstructions and multiplanar reformatted images were submitted for review.   LIMITATIONS: Evaluation of various soft tissue structures as well as the vasculature is limited without the availability of intravenous contrast. Parameters utilized in some study protocols to mitigate patient radiation exposure can also reduce sensitivity and specificity of assessment.  FINDINGS: . Lower Chest: Small right pleural effusion.  . Liver: No suspicious focal findings. . Gallbladder/biliary: Unremarkable. SABRA Spleen: Unremarkable. . Pancreas: Unremarkable. . Adrenals: Unremarkable. . Kidneys: Moderate to severe left hydronephrosis with heterogeneous content. Moderate right hydronephrosis with heterogeneous content.  . Peritoneum/mesenteries/extraperitoneum: Right retroperitoneal sterility indeterminant fluid collections measuring approximately 5.8 x 3.5 cm (series 3, image 56) and 3.6 x 4.7 cm (image 79). The more superior collection extends along the right retrorenal interfascial plane, exerting mass effect on the renal parenchyma. The more inferior collection tracks along the anterolateral aspect of the right psoas, inseparable from the ureter. Assessment is otherwise quite limited given the lack of iodinated contrast media. Trace volume abdominopelvic ascites. Redemonstrated left greater than right pelvic sidewall lymphadenopathy. Mesenteric edema. Fatty expansion of the left inguinal canal. No free air.   . Gastrointestinal tract: No evidence of obstruction.  . Ureters: Moderate bilateral hydroureter with heterogeneous content. . Bladder: Redemonstrated circumferential  bladder thickening. . Reproductive System: Prostatomegaly measuring approximately 5.1 cm.  . Vascular: Unremarkable. . Musculoskeletal: No acute displaced fractures. Polyarticular degenerative changes.  Body wall edema. Redemonstrated multifocal osseous metastases.    Impression:     1.  Left greater than right severe hydroureteronephrosis further detailed above with heterogenous content within the collecting system which may reflect blood products versus infectious debris. 2.  Right retroperitoneal sterility indeterminant fluid collections of uncertain etiology with differentials including urinoma and abscess. 3.  Small right pleural effusion. 4.  Redemonstrated and ancillary findings, as above.   PET PROSTATE PSMA - In process [[id number]] Resulted: 11/15/23 1217   Order Status: Sent Updated: 11/15/23 1332   This result has not been signed. Information might be incomplete.       ASSESSMENT AND PLAN Raeshaun Simson is a 63 y.o. male with the following:  PROBLEM 1 Summary: High-volume metastatic hormone-sensitive prostate cancer, currently treated with Bicalutamide monotherapy, for which we recommend referral to Med Onc for ongoing management and escalation of medical therapy since there is no role of surgical intervention in mHSPC.  Plan: - Refer to Med Onc. - Continue Bicalutamide in the interim. - Keep previously scheduled appointment with Rad Onc (12/10/23).  Follow-up: RTC PRN  Shared decision-making: Shared decision-making was conducted with the patient (and family as appropriate) regarding diagnostic and treatment options, incorporating patient values, goals, and understanding of risk.  Counseling: The patient was counseled on the steps, risks, benefits, and alternatives of the recommended treatment plan, with attention to their preferences, values, and goals of care.  Justification for medical necessity: Oncologic Risk - Given the above diagnosis and the potential for  disease progression, non-surgical intervention is warranted. Response to Prior Treatment - Monitoring for recurrence or progression following treatment.  PROBLEM 2 Summary: Bilateral malignant ureteral obstruction with improving CKD (11/22/23 Cr 3.32), currently managed with bilateral PCNs, which we recommend continuing until he experiences a sufficient response to therapy that results in alleviation or resolution of his outflow obstruction.  Plan: - Refer to Nephrology. - Continue routine PCN exchanges (next on 02/08/24).  Follow-up: RTC PRN  Shared decision-making: Shared decision-making was conducted with the patient (and family as appropriate) regarding diagnostic and treatment options, incorporating patient values, goals, and understanding of risk.  Counseling: The patient was counseled on the steps, risks, benefits, and alternatives of the recommended intervention, with attention to their preferences, values, and goals of care.  Justification for medical necessity: Response to Prior Treatment - Monitoring for recurrence or progression following treatment. Symptom-Driven Concern - Evaluation of new/worsening symptoms require diagnostic clarification.  ORDERS Orders Placed This Encounter  Procedures  . Ambulatory referral to Medical Oncology  . Ambulatory referral to Nephrology     PREOPERATIVE NEEDS N/A  MEDICAL DECISION-MAKING Data synthesis: Multiple sources of clinical data, including imaging, pathology, and laboratory results, were reviewed and integrated to inform diagnosis, staging, and treatment planning. Prior and external records were reconciled where applicable.  Amount and/or Complexity of Data Reviewed and/or Analyzed: Moderate - Review of labs, imaging, or pathology, or independent interpretation of test results  Number and Complexity of Problems Addressed: High - Multiple complex or progressing problems, or life-threatening/new serious condition requiring urgent  action Risk of Complications and/or Morbidity or Mortality of Patient Management: High - Advanced malignancy, systemic therapy, high-risk surgery, or major complications managed  COUNSELING DISCUSSION I counseled the patient regarding the management of newly diagnosed metastatic hormone-sensitive prostate cancer (mHSPC). We discussed that the backbone of therapy is androgen deprivation therapy (ADT), typically with a GnRH agonist or antagonist, to suppress testosterone and control disease progression.  We reviewed the disease volume  classification, as it influences treatment options: - Low-volume disease: Defined as the absence of visceral metastases and fewer than four bone metastases (with none outside of the spine/pelvis). - High-volume disease: Defined as the presence of visceral metastases and/or >=4 bone metastases with at least one outside of the spine/pelvis.  For low-volume disease, we discussed: - ADT alone as a baseline treatment option. - Treatment intensification with an androgen receptor signaling inhibitor (ARSI) such as abiraterone, apalutamide, or enzalutamide, which has shown improved survival compared to ADT alone. - Radiation to the primary tumor (if intact) in combination with systemic therapy, which has been shown to improve survival in this subset. - Docetaxel chemotherapy is less commonly utilized in low-volume disease but may be considered in select cases.  For high-volume disease, we discussed: - ADT plus docetaxel chemotherapy as an option based on CHAARTED and STAMPEDE data showing overall survival benefit. - ADT plus ARSI (abiraterone, apalutamide, or enzalutamide), which is an alternative to chemotherapy and has also demonstrated survival benefit. - Triplet therapy (ADT + docetaxel + ARSI) as an emerging standard for fit patients with high-volume disease, based on trials like PEACE-1 and ARASENS.  We reviewed the goals of therapy (disease control, symptom  management, prolonging survival) and the potential adverse effects of each treatment approach, including hot flashes, fatigue, sexual dysfunction, metabolic changes, bone loss (with ADT), hypertension, liver function abnormalities (with ARSIs), and chemotherapy-related toxicities (with docetaxel).  We also discussed supportive care, including bone health management (vitamin D, calcium , weight-bearing exercise, consideration of bone-targeted therapy), monitoring for cardiovascular risk, and psychosocial support.  We discussed these options, including the potential benefits, risks, and alternatives. The patient verbalized understanding of the diagnosis and proposed treatment plan, had the opportunity to ask questions, and demonstrated informed decision-making. He is aware that a referral to Medical Oncology is necessary for ongoing management of his mHSPC, and he is amenable to this plan of care. Shared decision-making occurred, and all questions were answered.  GC MODIFIER I was present with the resident during the key/critical portions of the service and participated in the patient's care. I have reviewed the resident's documentation and agree with the findings, assessment, and plan as documented. Any additions or revisions are included in the record.      [1] Past Medical History: Diagnosis Date  . Diabetes mellitus    (CMD)   [2] No past surgical history on file. [3] Family History Problem Relation Name Age of Onset  . Diabetes Mother    [4] Social History Socioeconomic History  . Marital status: Divorced  Tobacco Use  . Smoking status: Never    Passive exposure: Never  . Smokeless tobacco: Never  Vaping Use  . Vaping status: Never Used  Substance and Sexual Activity  . Alcohol use: Never  . Drug use: Never   Social Drivers of Health   Food Insecurity: Low Risk  (11/01/2023)   Food vital sign   . Within the past 12 months, you worried that your food would run out before you  got money to buy more: Never true   . Within the past 12 months, the food you bought just didn't last and you didn't have money to get more: Never true  Transportation Needs: No Transportation Needs (11/01/2023)   Transportation   . In the past 12 months, has lack of reliable transportation kept you from medical appointments, meetings, work or from getting things needed for daily living? : No  Safety: Low Risk  (11/01/2023)   Safety   .  How often does anyone, including family and friends, physically hurt you?: Never   . How often does anyone, including family and friends, insult or talk down to you?: Never   . How often does anyone, including family and friends, threaten you with harm?: Never   . How often does anyone, including family and friends, scream or curse at you?: Never  Living Situation: Low Risk  (11/01/2023)   Living Situation   . What is your living situation today?: I have a steady place to live  [5] No Known Allergies

## 2023-11-30 NOTE — Progress Notes (Addendum)
 GU Location of Tumor / Histology: Prostate Ca  If Prostate Cancer, Gleason Score is (4 + 4) and PSA is (106.78 on 10/25/2023)  Brandon [name] presented as referral from Dr. Bernardino Remington ([facility])  Biopsies   Past/Anticipated interventions by urology, if any: NA Past/Anticipated interventions by medical oncology, if any: NA  Weight changes, if any:  Weight loss in 45-50 lbs over 6 months.  IPSS:  15 SHIM:   9  Bowel/Bladder complaints, if any:  Bilateral urinary bags (Severe bilateral hydoureteronephosis).  No bowel complaints.  Nausea/Vomiting, if any:  no  Pain issues, if any:  0/10  SAFETY ISSUES: Prior radiation?  No Pacemaker/ICD? No Possible current pregnancy? Male Is the patient on methotrexate? No  Current Complaints / other details:     30 minutes spent total, including time for meaningful use questions, reviewing medication, as well as spent in face-to-face time in nurse evaluation with the patient.

## 2023-12-03 NOTE — Progress Notes (Signed)
 Radiation Oncology         [phone] ________________________________  Initial Outpatient Consultation  Name: Brandon [name] MRN: [id number]  Date: 12/04/2023  DOB: November 01, 1960  RR:[place], No Pcp Per  Vinita Motto, MD   REFERRING PHYSICIAN: Vinita Motto, MD  DIAGNOSIS: 63 y.o. gentleman with osseous metastatic adenocarcinoma of the prostate, with Gleason score of 4+4, and PSA of 106.78.  No diagnosis found.  HISTORY OF PRESENT ILLNESS: Brandon [name] is a 63 y.o. male with a diagnosis of prostate cancer. He initially presented to his PCP on 09/28/23 with abdominal pain and hematuria. He underwent a CT urogram on 10/16/23 showing: markedly irregular prostate with enhancing soft tissue infiltrating posterior bladder wall, seminal vesicles, and extending along mesorectal fascia; nodal disease extends to external iliac chains, however many common iliac and para-aortic nodes appear suspicious; moderate to severe bilateral hydroureteronephrosis, favored due to locally invasive prostate neoplasm; sclerotic osseous metastatic disease. He was subsequently referred for evaluation in urology by Dr. Nicholaus on 10/25/23, digital rectal examination performed at that time showed ***. {I can't access Dr. Sallee note to see the actual physical exam, sorry! -K} A PSA obtained that day revealed significant elevation at 106.78. The patient proceeded to transrectal ultrasound with 12 biopsies of the prostate on 11/01/23.  The prostate volume measured 42.26 cc.  Out of 10 core biopsies, all 10 were positive.  The maximum Gleason score was 4+4, and this was seen in all four left-sided cores. Additionally, Gleason 4+3 was seen in all six right-sided cores. 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  He was started on ADT with bicalutamide on 11/07/23 by Dr. Nicholaus. He underwent a staging PSMA PET scan on 11/15/23, which confirmed: multifocal intensely radiotracer-avid skeletal  metastasis within axillary skeleton; no evidence of nodal metastasis, enlarged left external iliac node does not have significant radiotracer activity; no evidence of visceral metastasis; bilateral pleural effusions.  Of note, he was admitted to the hospital following his PET scan on 11/15/23 and underwent bilateral nephrostomy tube placement the following day.  The patient reviewed the biopsy results with his urologist and he has kindly been referred today for discussion of potential radiation treatment options.   PREVIOUS RADIATION THERAPY: No  PAST MEDICAL HISTORY:  Past Medical History:  Diagnosis Date   Diabetes mellitus without complication (HCC)       PAST SURGICAL HISTORY:No past surgical history on file.  FAMILY HISTORY:  Family History  Problem Relation Age of Onset   Diabetes Mother    Diabetes Maternal Grandmother     SOCIAL HISTORY:  Social History   Socioeconomic History   Marital status: Married    Spouse name: Not on file   Number of children: Not on file   Years of education: Not on file   Highest education level: Not on file  Occupational History   Not on file  Tobacco Use   Smoking status: Never   Smokeless tobacco: Never  Substance and Sexual Activity   Alcohol use: No   Drug use: No   Sexual activity: Not on file  Other Topics Concern   Not on file  Social History Narrative   Not on file   Social Drivers of Health   Financial Resource Strain: Not on file  Food Insecurity: Low Risk  (11/01/2023)   Received from [facility]   Hunger Vital Sign    Within the past 12 months, you worried that your food would  run out before you got money to buy more: Never true    Within the past 12 months, the food you bought just didn't last and you didn't have money to get more. : Never true  Transportation Needs: No Transportation Needs (11/01/2023)   Received from [facility]    In the past 12 months, has lack of reliable transportation  kept you from medical appointments, meetings, work or from getting things needed for daily living? : No  Physical Activity: Not on file  Stress: Not on file  Social Connections: Unknown (08/15/2021)   Received from [facility]   Social Network    Social Network: Not on file  Intimate Partner Violence: Unknown (07/07/2021)   Received from [facility]   HITS    Physically Hurt: Not on file    Insult or Talk Down To: Not on file    Threaten Physical Harm: Not on file    Scream or Curse: Not on file    ALLERGIES: Patient has no known allergies.  MEDICATIONS:  Current Outpatient Medications  Medication Sig Dispense Refill   atorvastatin  (LIPITOR) 20 MG tablet Take 20 mg by mouth at bedtime.     Insulin  Isophane & Regular Human (NOVOLIN  70/30 FLEXPEN RELION) (70-30) 100 UNIT/ML PEN Inject 20 Units into the skin 2 (two) times daily with a meal. 12 mL 3   Insulin  Pen Needle (RELION PEN NEEDLES) 32G X 4 MM MISC 1 Device by Does not apply route 2 (two) times daily. 100 each 3   Multiple Vitamins-Minerals (MULTIVITAMIN WITH MINERALS) tablet Take 1 tablet by mouth daily.     No current facility-administered medications for this encounter.    REVIEW OF SYSTEMS:  On review of systems, the patient reports that he is doing well overall. He denies any chest pain, shortness of breath, cough, fevers, chills, night sweats, unintended weight changes. He denies any bowel disturbances, and denies abdominal pain, nausea or vomiting. He denies any new musculoskeletal or joint aches or pains. His IPSS was ***, indicating *** urinary symptoms. His SHIM was ***, indicating he {does not have/has mild/moderate/severe} erectile dysfunction. A complete review of systems is obtained and is otherwise negative.    PHYSICAL EXAM:  Wt Readings from Last 3 Encounters:  01/13/19 184 lb 9.6 oz (83.7 kg)  11/15/18 184 lb 9.6 oz (83.7 kg)  08/16/18 183 lb 9.6 oz (83.3 kg)   Temp Readings from Last 3 Encounters:   11/15/18 98.6 F (37 C)  08/16/18 98.1 F (36.7 C)  08/23/16 98 F (36.7 C) (Oral)   BP Readings from Last 3 Encounters:  01/13/19 118/70  11/15/18 132/82  08/16/18 (!) 132/92   Pulse Readings from Last 3 Encounters:  01/13/19 90  11/15/18 78  08/16/18 74    /10  In general this is a well appearing *** male in no acute distress. He's alert and oriented x4 and appropriate throughout the examination. Cardiopulmonary assessment is negative for acute distress, and he exhibits normal effort.     KPS = ***  100 - Normal; no complaints; no evidence of disease. 90   - Able to carry on normal activity; minor signs or symptoms of disease. 80   - Normal activity with effort; some signs or symptoms of disease. 72   - Cares for self; unable to carry on normal activity or to do active work. 60   - Requires occasional assistance, but is able to care for most of his personal needs.  50   - Requires considerable assistance and frequent medical care. 40   - Disabled; requires special care and assistance. 30   - Severely disabled; hospital admission is indicated although death not imminent. 20   - Very sick; hospital admission necessary; active supportive treatment necessary. 10   - Moribund; fatal processes progressing rapidly. 0     - Dead  Karnofsky DA, Abelmann WH, Craver LS and Burchenal [facility] [phone]) The use of the nitrogen mustards in the palliative treatment of carcinoma: with particular reference to bronchogenic carcinoma Cancer 1 634-56  LABORATORY DATA:  Lab Results  Component Value Date   WBC 4.6 01/13/2019   HGB 15.3 01/13/2019   HCT 46.0 01/13/2019   MCV 86.2 01/13/2019   PLT 196.0 01/13/2019   Lab Results  Component Value Date   NA 133 (L) 01/13/2019   K 4.8 01/13/2019   CL 97 01/13/2019   CO2 27 01/13/2019   Lab Results  Component Value Date   ALT 59 08/22/2016   AST 30 08/22/2016   ALKPHOS 77 08/22/2016   BILITOT 0.7 08/22/2016     RADIOGRAPHY: No results  found.    IMPRESSION/PLAN: 1. 63 y.o. gentleman with osseous metastatic adenocarcinoma of the prostate, with Gleason score of 4+4, and PSA of 106.78. We discussed the patient's workup and outlined the nature of prostate cancer in this setting. Given his osseous metastatic disease, the treatment recommendation consists of LT-ADT concurrent with 8 weeks of external radiation, {5 weeks of external radiation with an upfront brachytherapy boost,} or prostatectomy. We discussed the available radiation techniques, and focused on the details and logistics of delivery. We discussed and outlined the risks, benefits, short and long-term effects associated with radiotherapy and compared and contrasted these with prostatectomy. We discussed the role of SpaceOAR gel in reducing the rectal toxicity associated with radiotherapy. He was started on ADT with bicalutamide on 11/07/23.  He appears to have a good understanding of his disease and our treatment recommendations which are of curative intent.  He was encouraged to ask questions that were answered to his stated satisfaction.  At the conclusion of our conversation, the patient is interested in moving forward with ***.  We personally spent *** minutes in this encounter including chart review, reviewing radiological studies, meeting face-to-face with the patient, entering orders and completing documentation.    Sabra MICAEL Rusk, PA-C    Donnice Barge, MD  [facility]  Radiation Oncology Direct Dial: [phone]  Fax: [phone] [email or web address]  Skype  [email or web address]   This document serves as a record of services personally performed by Donnice Barge, MD and Sabra Rusk, PA-C. It was created on their behalf by Izetta Neither, a trained medical scribe. The creation of this record is based on the scribe's personal observations and the provider's statements to them. This document has been checked and approved by the attending provider.

## 2023-12-04 ENCOUNTER — Telehealth: Payer: Self-pay

## 2023-12-04 ENCOUNTER — Inpatient Hospital Stay: Payer: Self-pay

## 2023-12-04 ENCOUNTER — Inpatient Hospital Stay
Admission: RE | Admit: 2023-12-04 | Discharge: 2023-12-04 | Disposition: A | Discharge status: Home / Self Care | Payer: Self-pay | Source: Ambulatory Visit

## 2023-12-04 ENCOUNTER — Ambulatory Visit
Admission: RE | Admit: 2023-12-04 | Discharge: 2023-12-04 | Disposition: A | Discharge status: Home / Self Care | Payer: Self-pay | Source: Ambulatory Visit | Attending: Radiation Oncology | Admitting: Radiation Oncology

## 2023-12-04 ENCOUNTER — Encounter: Payer: Self-pay | Admitting: Radiation Oncology

## 2023-12-04 ENCOUNTER — Other Ambulatory Visit (HOSPITAL_COMMUNITY): Payer: Self-pay

## 2023-12-04 ENCOUNTER — Other Ambulatory Visit: Payer: Self-pay

## 2023-12-04 ENCOUNTER — Inpatient Hospital Stay (HOSPITAL_BASED_OUTPATIENT_CLINIC_OR_DEPARTMENT_OTHER): Payer: Self-pay

## 2023-12-04 VITALS — BP 130/83 | HR 92 | Temp 97.4°F | Resp 18 | Ht 74.0 in | Wt 144.2 lb

## 2023-12-04 DIAGNOSIS — C7951 Secondary malignant neoplasm of bone: Secondary | ICD-10-CM

## 2023-12-04 DIAGNOSIS — C61 Malignant neoplasm of prostate: Secondary | ICD-10-CM

## 2023-12-04 DIAGNOSIS — Z79899 Other long term (current) drug therapy: Secondary | ICD-10-CM

## 2023-12-04 DIAGNOSIS — D649 Anemia, unspecified: Secondary | ICD-10-CM

## 2023-12-04 DIAGNOSIS — Z5941 Food insecurity: Secondary | ICD-10-CM

## 2023-12-04 DIAGNOSIS — E119 Type 2 diabetes mellitus without complications: Secondary | ICD-10-CM | POA: Insufficient documentation

## 2023-12-04 DIAGNOSIS — E785 Hyperlipidemia, unspecified: Secondary | ICD-10-CM

## 2023-12-04 DIAGNOSIS — Z833 Family history of diabetes mellitus: Secondary | ICD-10-CM | POA: Insufficient documentation

## 2023-12-04 DIAGNOSIS — R0602 Shortness of breath: Secondary | ICD-10-CM | POA: Insufficient documentation

## 2023-12-04 DIAGNOSIS — Z794 Long term (current) use of insulin: Secondary | ICD-10-CM | POA: Insufficient documentation

## 2023-12-04 DIAGNOSIS — N179 Acute kidney failure, unspecified: Secondary | ICD-10-CM | POA: Insufficient documentation

## 2023-12-04 DIAGNOSIS — Z9189 Other specified personal risk factors, not elsewhere classified: Secondary | ICD-10-CM | POA: Insufficient documentation

## 2023-12-04 DIAGNOSIS — Z79633 Long term (current) use of mitotic inhibitor: Secondary | ICD-10-CM | POA: Insufficient documentation

## 2023-12-04 DIAGNOSIS — R42 Dizziness and giddiness: Secondary | ICD-10-CM | POA: Insufficient documentation

## 2023-12-04 DIAGNOSIS — D6182 Myelophthisis: Secondary | ICD-10-CM | POA: Insufficient documentation

## 2023-12-04 DIAGNOSIS — Z5112 Encounter for antineoplastic immunotherapy: Secondary | ICD-10-CM | POA: Insufficient documentation

## 2023-12-04 HISTORY — DX: Malignant neoplasm of prostate: C61

## 2023-12-04 LAB — CBC WITH DIFFERENTIAL (CANCER CENTER ONLY)
Abs Immature Granulocytes: 0.01 K/uL (ref 0.00–0.07)
Basophils Absolute: 0 K/uL (ref 0.0–0.1)
Basophils Relative: 1 %
Eosinophils Absolute: 0.1 K/uL (ref 0.0–0.5)
Eosinophils Relative: 1 %
HCT: 28.2 % — ABNORMAL LOW (ref 39.0–52.0)
Hemoglobin: 9.1 g/dL — ABNORMAL LOW (ref 13.0–17.0)
Immature Granulocytes: 0 %
Lymphocytes Relative: 11 %
Lymphs Abs: 0.6 K/uL — ABNORMAL LOW (ref 0.7–4.0)
MCH: 27.2 pg (ref 26.0–34.0)
MCHC: 32.3 g/dL (ref 30.0–36.0)
MCV: 84.4 fL (ref 80.0–100.0)
Monocytes Absolute: 0.2 K/uL (ref 0.1–1.0)
Monocytes Relative: 4 %
Neutro Abs: 4.5 K/uL (ref 1.7–7.7)
Neutrophils Relative %: 83 %
Platelet Count: 251 K/uL (ref 150–400)
RBC: 3.34 MIL/uL — ABNORMAL LOW (ref 4.22–5.81)
RDW: 15.9 % — ABNORMAL HIGH (ref 11.5–15.5)
WBC Count: 5.5 K/uL (ref 4.0–10.5)
nRBC: 0 % (ref 0.0–0.2)

## 2023-12-04 LAB — CMP (CANCER CENTER ONLY)
ALT: 10 U/L (ref 0–44)
AST: 13 U/L — ABNORMAL LOW (ref 15–41)
Albumin: 4.2 g/dL (ref 3.5–5.0)
Alkaline Phosphatase: 80 U/L (ref 38–126)
Anion gap: 10 (ref 5–15)
BUN: 79 mg/dL — ABNORMAL HIGH (ref 8–23)
CO2: 21 mmol/L — ABNORMAL LOW (ref 22–32)
Calcium: 9.3 mg/dL (ref 8.9–10.3)
Chloride: 106 mmol/L (ref 98–111)
Creatinine: 3.09 mg/dL — ABNORMAL HIGH (ref 0.61–1.24)
GFR, Estimated: 22 mL/min — ABNORMAL LOW (ref >60)
Glucose, Bld: 156 mg/dL — ABNORMAL HIGH (ref 70–99)
Potassium: 4.6 mmol/L (ref 3.5–5.1)
Sodium: 137 mmol/L (ref 135–145)
Total Bilirubin: 0.4 mg/dL (ref 0.0–1.2)
Total Protein: 7.9 g/dL (ref 6.5–8.1)

## 2023-12-04 LAB — LACTATE DEHYDROGENASE: LDH: 198 U/L — ABNORMAL HIGH (ref 98–192)

## 2023-12-04 LAB — FOLATE: Folate: 9.4 ng/mL (ref >5.9)

## 2023-12-04 LAB — CEA (ACCESS): CEA (CHCC): 2.47 ng/mL (ref 0.00–5.00)

## 2023-12-04 LAB — VITAMIN B12: Vitamin B-12: 1864 pg/mL — ABNORMAL HIGH (ref 180–914)

## 2023-12-04 LAB — FERRITIN: Ferritin: 694 ng/mL — ABNORMAL HIGH (ref 24–336)

## 2023-12-04 MED ORDER — DAROLUTAMIDE 300 MG PO TABS: 600.0000 mg | ORAL_TABLET | Freq: Two times a day (BID) | ORAL | 11 refills | Status: DC

## 2023-12-04 MED ORDER — RELUGOLIX 120 MG PO TABS: ORAL_TABLET | ORAL | 0 refills | Status: DC

## 2023-12-04 NOTE — Progress Notes (Signed)
 Introduced myself to the patient as the prostate nurse navigator.  No barriers to care identified at this time.  He is here to discuss his radiation treatment options, and he is currently not a radiation oncology candidate.  RN coordinated for patient to meet with medical oncology today, 9/2, and 12:30 pm.  I gave him my business card and asked him to call me with questions or concerns.  Verbalized understanding.   RN will follow after medical oncology.

## 2023-12-04 NOTE — Progress Notes (Signed)
 [facility] CONSULT NOTE  Patient Care Team: Patient, No Pcp Per as PCP - General (General Practice) Vertell Pont, RN as Oncology Nurse Navigator  ASSESSMENT & PLAN:  Brandon [name] is a 63 y.o.male with history of IDM2, hyperlipidemia, being seen at Medical Oncology Clinic for prostate cancer.  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 PSA  107 Germline testing: will discuss next visit and refer Somatic testing: to be obtain Treatment: Recommend triple therapy  The patient was counseled on the natural history of prostate cancer and the standard treatment options that are available for prostate cancer.   Discussed ARASENS trial. An international, phase 3 trial, we randomly assigned patients with metastatic, hormone-sensitive prostate cancer in a 1:1 ratio to receive darolutamide  600-mg twice daily) or matching placebo, both in combination with androgen-deprivation therapy and docetaxel. Bone metastases were well represented.  The overall survival at 4 years was 62.7% in the darolutamide  group and 50.4% in the placebo group. M1 disease was well represented. We do not have data without docetaxel in this study. Time to [facility] was over 4 years with triplet therapy.   We discussed docetaxel chemotherapy.  Potential side effect including fatigue, neutropenia, febrile neutropenia, thrombocytopenia, anemia, alopecia, skin rash, loss of nail and potentially infection, hypersensitivity, fluid retention with edema, diarrhea, nausea, vomiting, mouth sores, liver enzyme elevation, neuropathy, muscle pain, joint pain, visual change and rarely severe allergic reaction, skin rash, severe infection resulting in sepsis, and potential death.  Discussed the importance of recognizing fever, signs of infection while on treatment. Proceed to ED for emergency evaluation in the setting of fever, infection. Severe infection resulting in sepsis can be life  threatening and result in death if no treated early. 4% of deaths were reported in both group.  ARPI can cause fatigue, and rarely rash, heart failure, ischemic heart disease in <5%.  In patients undergoing ADT with prostate cancer, a question of manage cardiovascular risk factors to decrease cardiac events recommended including controlling hypertension, hyperlipidemia, prevent diabetes and regular exercises are recommended.   Patient derive some benefit from bicalutamide over the last few weeks, especially after nephrostomy tube placement.  Discussed given his significant disease burden, affecting kidney function, bladder function, hydronephrosis, recommend adding chemotherapy.  After discussion, he expressed understanding and would like to proceed. Assessment & Plan Malignant neoplasm of prostate [facility]) Schedule teaching for docetaxel Will plan to start docetaxel with Orgovyx  and darolutamide  if approved Continue bicalutamide until able to start triplet therapy Social work consult Normocytic anemia Last hemoglobin was 8.2 on discharge.  Repeat labs today B12, folate, ferritin At risk for adverse drug event calcium  (1000-1200 mg daily from food and supplements) and vitamin D3 (1000 IU daily) Healthy lifestyle to prevent diabetes and CV disease Aggressive cardiovascular risk management Weight-bearing exercises (30 minutes per day) Limit alcohol consumption and avoid smoking   Orders Placed This Encounter  Procedures   CBC with Differential (Cancer Center Only)    Standing Status:   Future    Expected Date:   12/11/2023    Expiration Date:   12/10/2024   CMP (Cancer Center only)    Standing Status:   Future    Expected Date:   12/11/2023    Expiration Date:   12/10/2024   CBC with Differential (Cancer Center Only)    Standing Status:   Future    Number of Occurrences:   1    Expiration Date:  12/03/2024   CMP (Cancer Center only)    Standing Status:   Future    Number of Occurrences:   1     Expiration Date:   12/03/2024   Lactate dehydrogenase    Standing Status:   Future    Number of Occurrences:   1    Expiration Date:   12/03/2024   Vitamin B12    Standing Status:   Future    Number of Occurrences:   1    Expiration Date:   12/03/2024   Folate    Standing Status:   Future    Number of Occurrences:   1    Expiration Date:   12/03/2024   Ferritin    Standing Status:   Future    Number of Occurrences:   1    Expiration Date:   12/03/2024   Prostate-Specific AG, Serum    Standing Status:   Future    Number of Occurrences:   1    Expiration Date:   12/03/2024   Testosterone     Standing Status:   Future    Number of Occurrences:   1    Expiration Date:   12/03/2024   CEA (Access)    Standing Status:   Future    Number of Occurrences:   1    Expiration Date:   12/03/2024   Ambulatory referral to Social Work    Referral Priority:   Routine    Referral Type:   Consultation    Referral Reason:   Specialty Services Required    Number of Visits Requested:   1    All questions were answered. The patient knows to call the clinic with any problems, questions or concerns. No barriers to learning was detected.  Brandon JAYSON Chihuahua, MD 9/2/20254:39 PM  CHIEF COMPLAINTS/PURPOSE OF CONSULTATION:  Prostate cancer  HISTORY OF PRESENTING ILLNESS:  Brandon [name] 63 y.o. male is here because of prostate cancer.  I have reviewed his chart and materials related to his cancer extensively and collaborated history with the patient. Summary of oncologic history is as follows:  Patient presented with abdominal pain, loss of appetite, unexpected weight loss and gross hematuria in June 2025 and found to have irregular prostate with enhancing soft tissue infiltrating posterior bladder wall, seminal vesicle and extending along the mesorectal fascia with nodal disease.  Prostate biopsy showed 4 possible GG 4 prostate cancer.  PSA was 107.  PSMA PET scan showed diffuse bone metastases.  Patient was  started with bicalutamide by Dr. Nicholaus.  He was admitted to the hospital and underwent bilateral nephrostomy tube placement.  His creatinine on 8/15 was 8.64.  Creatinine was 3.32 on 8/21 on discharge.   Oncology History  Malignant neoplasm of prostate (HCC)  10/16/2023 Imaging   CT urogram 1.  Moderate to severe bilateral hydroureteronephrosis, favored due to a locally invasive prostate neoplasm as discussed below. Recommend prompt evaluation of renal dysfunction and consideration for percutaneous decompression. Additionally, recommend correlation with PSA and urologic evaluation.  2.  Markedly irregular prostate with enhancing soft tissue infiltrating the posterior bladder wall, seminal vesicles, and extends along the mesorectal fascia. Nodal disease extends to the external iliac chain nodes, however many of the common iliac and para-aortic nodes appear suspicious.  3.  Delayed bilateral nephrograms with no contrast excretion on delayed phase in the left kidney, indicative of compromised renal function.  4.  Sclerotic osseous metastatic disease.    10/30/2023 Initial Diagnosis   Malignant neoplasm  of prostate (HCC)   11/01/2023 Cancer Staging   Staging form: Prostate, AJCC 8th Edition - Clinical stage from 11/01/2023: Stage IVB (cT2c, cN0, pM1b, PSA: 107, Grade Group: 4) - Signed by Sherwood Rise, PA-C on 12/04/2023 Histopathologic type: Adenocarcinoma, NOS Stage prefix: Initial diagnosis Prostate specific antigen (PSA) range: 20 or greater Gleason primary pattern: 4 Gleason secondary pattern: 4 Gleason score: 8 Histologic grading system: 5 grade system Number of biopsy cores examined: 10 Number of biopsy cores positive: 10 Location of positive needle core biopsies: Both sides   11/01/2023 Pathology Results   Biopsy from [facility]. A. PROSTATE, LEFT APEX, CORE BIOPSY:  Prostatic adenocarcinoma, Grade group 4 (Gleason score 4+4=8), involving approximately 10% of the prostatic  tissue. Intraductal carcinoma present.               B. PROSTATE, LEFT MID, CORE BIOPSY:               Prostatic adenocarcinoma, Grade group 4 (Gleason score 4+4=8),  involving approximately 25% of the prostatic tissue; Cribriform glands present. Atypical intraductal proliferation.               C. PROSTATE, LEFT BASE, CORE BIOPSY:               Prostatic adenocarcinoma, Grade group 4 (Gleason score 4+4=8),  involving 2 of 2 cores and 60-70% of the prostatic tissue; Cribriform glands present. Intraductal carcinoma present.              Perineural invasion identified.   D. PROSTATE, RIGHT APEX, CORE BIOPSY:               Prostatic adenocarcinoma, Grade group 3 (Gleason score 4+3=7), involving 2 of 2 cores and 15% of the prostatic tissue.   E. PROSTATE, RIGHT MID, CORE BIOPSY:               Prostatic adenocarcinoma, Grade group 3 (Gleason grade 4+3=7),  involving 2 of 2 cores and 10-15% of the prostatic tissue.              Intraductal carcinoma present. Perineural invasion identified.   F. PROSTATE, RIGHT BASE, CORE BIOPSY:                Prostatic adenocarcinoma, Grade group 3 (Gleason score 4+3=7), involving 2 of 2 cores and 20-25% of the prostatic tissue.              Intraductal carcinoma present.              Perineural invasion identified.   11/15/2023 PET scan   PSMA  PET 1. Multifocal intense radiotracer avid skeletal metastasis within the axillary skeleton.  2. No evidence of nodal metastasis. Enlarged LEFT external iliac node does not have significant radiotracer activity  3. No evidence of visceral metastasis.  4. Mild nonfocal radiotracer activity in the prostate gland.  5. Bilateral pleural effusions.    12/11/2023 -  Chemotherapy   Patient is on Treatment Plan : PROSTATE Docetaxel (75) + Prednisone  q21d       MEDICAL HISTORY:  Past Medical History:  Diagnosis Date   Diabetes mellitus without complication (HCC)    Prostate cancer (HCC)     SURGICAL  HISTORY: No past surgical history on file.  SOCIAL HISTORY: Social History   Socioeconomic History   Marital status: Married    Spouse name: Not on file   Number of children: Not on file   Years of education: Not on file   Highest  education level: Not on file  Occupational History   Not on file  Tobacco Use   Smoking status: Never   Smokeless tobacco: Never  Vaping Use   Vaping status: Never Used  Substance and Sexual Activity   Alcohol use: No   Drug use: No   Sexual activity: Not on file  Other Topics Concern   Not on file  Social History Narrative   Not on file   Social Drivers of Health   Financial Resource Strain: Not on file  Food Insecurity: Food Insecurity Present (12/04/2023)   Hunger Vital Sign    Worried About Running Out of Food in the Last Year: Often true    Ran Out of Food in the Last Year: Sometimes true  Transportation Needs: No Transportation Needs (12/04/2023)   PRAPARE - Administrator, Civil Service (Medical): No    Lack of Transportation (Non-Medical): No  Physical Activity: Not on file  Stress: Not on file  Social Connections: Unknown (08/15/2021)   [facility]   Social Network    Social Network: Not on file  Intimate Partner Violence: Not At Risk (12/04/2023)   Humiliation, Afraid, Rape, and Kick questionnaire    Fear of Current or Ex-Partner: No    Emotionally Abused: No    Physically Abused: No    Sexually Abused: No    FAMILY HISTORY: Family History  Problem Relation Age of Onset   Diabetes Mother    Diabetes Maternal Grandmother     ALLERGIES:  has no known allergies.  MEDICATIONS:  Current Outpatient Medications  Medication Sig Dispense Refill   darolutamide  (NUBEQA ) 300 MG tablet Take 2 tablets (600 mg total) by mouth 2 (two) times daily with a meal. 120 tablet 11   relugolix  (ORGOVYX ) 120 MG tablet Take 3 tablets (360 mg total) by mouth on day 1, then take 1 tablet (120 mg total) daily thereafter. 30  tablet 0   atorvastatin  (LIPITOR) 20 MG tablet Take 20 mg by mouth at bedtime.     Blood Glucose Monitoring Suppl (ACCU-CHEK GUIDE) w/Device KIT 4 (four) times daily.     FORA Lancets MISC Lancets, ACHS (before meals and at bedtime), ICD-10 Code: Type 2 - E11.9     gabapentin (NEURONTIN) 100 MG capsule Take 100 mg by mouth 2 (two) times daily.     Insulin  Isophane & Regular Human (NOVOLIN  70/30 FLEXPEN RELION) (70-30) 100 UNIT/ML PEN Inject 20 Units into the skin 2 (two) times daily with a meal. 12 mL 3   insulin  lispro (HUMALOG) 100 UNIT/ML KwikPen Inject into the skin.     Insulin  Pen Needle (RELION PEN NEEDLES) 32G X 4 MM MISC 1 Device by Does not apply route 2 (two) times daily. 100 each 3   leuprolide, 6 Month, (LUPRON) 45 MG injection Inject 45 mg into the muscle every 6 (six) months. (Patient not taking: Reported on 12/04/2023)     Multiple Vitamins-Minerals (MULTIVITAMIN WITH MINERALS) tablet Take 1 tablet by mouth daily.     polyethylene glycol powder (GLYCOLAX/MIRALAX) 17 GM/SCOOP powder Take 17 g by mouth.     PRECISION QID TEST test strip Test Strips - Appropriate for meter, ACHS (before meals and at bedtime), ICD-10 Code: Type 2 - E11.9     senna (SENOKOT) 8.6 MG tablet Take 2 tablets by mouth at bedtime.     tamsulosin (FLOMAX) 0.4 MG CAPS capsule Take 0.4 mg by mouth daily.     No current facility-administered medications  for this visit.    REVIEW OF SYSTEMS:   All relevant systems were reviewed with the patient and are negative.  PHYSICAL EXAMINATION: ECOG PERFORMANCE STATUS: 1 - Symptomatic but completely ambulatory VSS  GENERAL: alert, no distress and comfortable EYES: sclera clear LUNGS: Effort normal, no respiratory distress.  Clear to auscultation bilaterally HEART: regular rate & rhythm and no lower extremity edema ABDOMEN: soft, non-tender and nondistended Bilateral nephrostomy tube.   LABORATORY DATA:  I have reviewed the results of PSA.  RADIOGRAPHIC  STUDIES: I have personally reviewed the radiological images as listed and agreed with the findings in the report.

## 2023-12-04 NOTE — Progress Notes (Signed)
START ON PATHWAY REGIMEN - Prostate     Darolutamide: A cycle is every 28 days:     Darolutamide    Docetaxel cycles 1 through 6: A cycle is every 21 days:     Docetaxel   **Always confirm dose/schedule in your pharmacy ordering system**  Patient Characteristics: Adenocarcinoma, Recurrent/New Systemic Disease (Including Biochemical Recurrence), Non-Castrate, M1 Histology: Adenocarcinoma Therapeutic Status: Recurrent/New Systemic Disease (Including Biochemical Recurrence) Intent of Therapy: Non-Curative / Palliative Intent, Discussed with Patient 

## 2023-12-04 NOTE — Assessment & Plan Note (Addendum)
 calcium  (1000-1200 mg daily from food and supplements) and vitamin D3 (1000 IU daily) Healthy lifestyle to prevent diabetes and CV disease Aggressive cardiovascular risk management Weight-bearing exercises (30 minutes per day) Limit alcohol consumption and avoid smoking

## 2023-12-04 NOTE — Assessment & Plan Note (Signed)
 Schedule teaching for docetaxel Will plan to start docetaxel with Orgovyx  and darolutamide  if approved Continue bicalutamide until able to start triplet therapy Social work consult

## 2023-12-04 NOTE — Telephone Encounter (Signed)
 Oral Oncology Pharmacist Encounter  Received new prescription for Nubeqa  (darolutamide ) and Orvoyx (relugolix ) for the treatment of metastatic hormone sensitive prostate cancer in conjunction with docetaxel, planned duration until disease progression or unacceptable toxicity.  Labs from 12/04/23 (CBC and CMP) assessed, creatinine significantly elevated this lab draw in addition to elevated BUN. Patient notified by RN that he needs to stay better hydrated, will not change prescription at this time as baseline is closer to 1. Will monitor before patient starts medication to determine if dosage needs to be decreased if his creatinine clearnace remains below 30. Prescription dose and frequency assessed for appropriateness.   Current medication list in Epic reviewed, no significant/ relevant DDIs with Nubeqa  and Orgovyx  identified.   Evaluated chart and no patient barriers to medication adherence noted.   Patient agreement for treatment documented in MD note on 12/04/2023.  Prescription has been e-scribed to the [facility] for benefits analysis and approval.  Oral Oncology Clinic will continue to follow for insurance authorization, copayment issues, initial counseling and start date.  Shara Hartis, PharmD Hematology/Oncology Clinical Pharmacist [facility] [phone] 12/04/2023 2:10 PM

## 2023-12-04 NOTE — Progress Notes (Signed)
 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 PSA  107   Recommendations: Schedule teaching for docetaxel (Apt for 9/5) Will plan to start docetaxel with Orgovyx  and darolutamide  if approved (Cycle 1 9/11 for docetaxel; pending review for daro and Orgovyx ) Continue bicalutamide until able to start triplet therapy Social work consult (referral placed)

## 2023-12-05 ENCOUNTER — Other Ambulatory Visit: Payer: Self-pay

## 2023-12-05 ENCOUNTER — Telehealth: Payer: Self-pay

## 2023-12-05 LAB — PROSTATE-SPECIFIC AG, SERUM (LABCORP): Prostate Specific Ag, Serum: 24.3 ng/mL — ABNORMAL HIGH (ref 0.0–4.0)

## 2023-12-05 LAB — TESTOSTERONE: Testosterone: 420 ng/dL (ref 264–916)

## 2023-12-05 NOTE — Telephone Encounter (Signed)
 I spoke with Brandon [name] and he is aware of his pharmacist appointment.

## 2023-12-05 NOTE — Telephone Encounter (Signed)
 I attempted to inform Brandon [name] of his added appointment after education on 9/5. I was unable to leave a message because he does not have a voicemailbox set up.

## 2023-12-06 ENCOUNTER — Inpatient Hospital Stay: Payer: Self-pay

## 2023-12-06 ENCOUNTER — Telehealth: Payer: Self-pay

## 2023-12-06 MED ORDER — PREDNISONE 5 MG PO TABS
5.0000 mg | ORAL_TABLET | Freq: Two times a day (BID) | ORAL | 11 refills | Status: AC
  Filled 2024-01-19: qty 60, 30d supply, fill #0
  Filled 2024-02-22 – 2024-02-23 (×2): qty 60, 30d supply, fill #1

## 2023-12-06 MED ORDER — PROCHLORPERAZINE MALEATE 10 MG PO TABS: 10.0000 mg | ORAL_TABLET | Freq: Four times a day (QID) | ORAL | 1 refills | Status: DC | PRN

## 2023-12-06 MED ORDER — DEXAMETHASONE 4 MG PO TABS
ORAL_TABLET | ORAL | 1 refills | Status: DC
Start: 2023-12-06 — End: 2024-02-07

## 2023-12-06 MED ORDER — ONDANSETRON HCL 8 MG PO TABS
8.0000 mg | ORAL_TABLET | Freq: Three times a day (TID) | ORAL | 1 refills | Status: DC | PRN
Start: 2023-12-06 — End: 2024-02-07

## 2023-12-06 NOTE — Progress Notes (Signed)
 [facility] Clinical Social Work  Initial Assessment   Brandon [name] is a 63 y.o. year old male contacted by phone. Clinical Social Work was referred by nurse navigator for assessment of psychosocial needs.   SDOH (Social Determinants of Health) assessments performed: Yes   SDOH Screenings   Food Insecurity: Food Insecurity Present (12/04/2023)  Housing: Low Risk  (12/04/2023)  Transportation Needs: No Transportation Needs (12/04/2023)  Utilities: Not At Risk (12/04/2023)  Alcohol Screen: Low Risk  (12/04/2023)  Depression (PHQ2-9): Low Risk  (12/04/2023)  Social Connections: Unknown (08/15/2021)   Received from [facility]  Tobacco Use: Low Risk  (12/04/2023)    PHQ 2/9:    12/04/2023   10:33 AM 02/22/2018    1:16 PM 08/22/2016   10:35 AM  Depression screen PHQ 2/9  Decreased Interest 0 0 3  Down, Depressed, Hopeless 0 0 1  PHQ - 2 Score 0 0 4  Altered sleeping   3  Tired, decreased energy   3  Change in appetite   3  Feeling bad or failure about yourself    0  Trouble concentrating   0  Moving slowly or fidgety/restless   0  Suicidal thoughts   0   PHQ-9 Score   13  Difficult doing work/chores   Somewhat difficult     Data saved with a previous flowsheet row definition     Distress Screen completed: No     No data to display            Family/Social Information:  Housing Arrangement: Brandon [name] lives with is spouse in [place].  Family members/support persons in your life? Brandon [name] named his spouse as his support system adding that he talks with God for other concerns. Transportation concerns: Brandon [name] will transport himself to his appointments. If he's unable to, his spouse will assist. No concerns about transportation noted.  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 Brandon [name] stated he had to stop working due to the effects of the cancer. He's had a substantial decrease in energy, and he did very physical work.  Income  source: No income at the time, has started process for applying for SSI /SSDI, has upcoming phone interview. Currently, his spouse is working to support household. Financial concerns: Yes, current concerns Type of concern: Medical bills and Food Food access concerns: yes, noted food Insecurity.  Religious or spiritual practice: Brandon [name] is [place]. He detailed his faith and how this helps with coping with the diagnosis. Advanced directives: No  Services Currently in place:  Transportation / Family / Faith  Coping/ Adjustment to diagnosis: Patient understands treatment plan and what happens next? yes, Brandon [name] understands his treatment plan and is eager to get started with treatment. Concerns about diagnosis and/or treatment: Brandon [name]'s primary concern is accessing medication he is currently uninsured.  Patient reported stressors: No reported stressors related to treatment. Brandon [name] denied any initial emotional distress Patient enjoys Prior to his energy levels declining, Brandon [name] enjoyed playing basketball and football with the kids in his life. Current coping skills/ strengths: Ability for insight , Active sense of humor , Average or above average intelligence , Capable of independent living , Communication skills , General fund of knowledge , Motivation for treatment/growth , Religious Affiliation , Special hobby/interest , and Supportive family/friends     SUMMARY: Current SDOH Barriers:  Financial constraints related to Medical Bills, Limited social support, and Limited access to food  Clinical Social Work  Clinical Goal(s):  Patient will work with SW to address concerns related to Financial Assistance related to medical cost Public affairs consultant) food insecurity (SNAP application), and social support.(Discussed support group)  Interventions: Discussed common feeling and emotions when being diagnosed with cancer, and the importance of support during treatment Informed patient of the  support team roles and support services at [facility] Placed referral for [employer], patient meets eligibility (uninsured). Patient is aware of active treatment requirements. Messaged patient's oral pharmacist to inquire about financial assistance for medications. Placed referral to DSS for Medicaid Application. Left SNAP application with front desk staff, patient understands he will need to return for submission. Provided CSW contact information and encouraged patient to call with any questions or concerns   Follow Up Plan: Patient will contact CSW with any support or resource needs   Lizbeth Sprague, LCSW Clinical Social Worker [facility]

## 2023-12-06 NOTE — Addendum Note (Signed)
 Addended by: TINA HUDSON on: 12/06/2023 09:42 PM   Modules accepted: Orders

## 2023-12-06 NOTE — Telephone Encounter (Signed)
 Oral Oncology Patient Advocate Encounter   Began application for assistance for Nubeqa  through [employer]. Application will be submitted upon completion of necessary supporting documentation. [employer] phone number [phone]. I will continue to check the status until final determination.   Began application for assistance for Orgovyx  through [employer]  Support Program. Sent rx electronically to [facility] for them to start Benefits Investigation. Patient aware to be on the lookout for a phone call from them. [employer]  Support Program phone number [phone].   I will continue to check the status until final determination.   Lucie Lamer, CPhT [place]  [facility] Pharmacy Technician Patient Advocate Specialist [address] [place] Phone: [phone]  Fax: [phone] [email or web address]

## 2023-12-06 NOTE — Progress Notes (Signed)
 Pharmacist Chemotherapy Monitoring - Initial Assessment    Anticipated start date: 12/13/23   The following has been reviewed per standard work regarding the patient's treatment regimen: The patient's diagnosis, treatment plan and drug doses, and organ/hematologic function Lab orders and baseline tests specific to treatment regimen  The treatment plan start date, drug sequencing, and pre-medications Prior authorization status  Patient's documented medication list, including drug-drug interaction screen and prescriptions for anti-emetics and supportive care specific to the treatment regimen The drug concentrations, fluid compatibility, administration routes, and timing of the medications to be used The patient's access for treatment and lifetime cumulative dose history, if applicable  The patient's medication allergies and previous infusion related reactions, if applicable   Changes made to treatment plan:  N/A  Follow up needed:  No insurance   Brandon [name] [place], [place], 12/06/2023  2:14 PM

## 2023-12-07 ENCOUNTER — Other Ambulatory Visit: Payer: Self-pay

## 2023-12-07 ENCOUNTER — Inpatient Hospital Stay (HOSPITAL_BASED_OUTPATIENT_CLINIC_OR_DEPARTMENT_OTHER): Payer: Self-pay

## 2023-12-07 ENCOUNTER — Inpatient Hospital Stay: Payer: Self-pay

## 2023-12-07 DIAGNOSIS — C61 Malignant neoplasm of prostate: Secondary | ICD-10-CM

## 2023-12-07 NOTE — Progress Notes (Signed)
 PGY2 Oncology Pharmacy Resident Encounter - Oral Chemo Initial Patient Education  I spoke with patient by in-person for overview of new oral chemotherapy medication: Nubeqa  (darolutamide ) and Orgovyx  (relugolix ) for the treatment of metastatic hormone sensitive prostate cancer in conjunction with docetaxel, planned duration until disease progression or unacceptable toxicity.   Planned Start Date: Treatment regimen:  Orgovyx  (relugolix ) take 3 tablets (360 mg) by mouth on day 1, then take 1 tablet (120 mg) daily thereafter.  Nubeqa  (darolutamide ) take 2 tablets (600 mg) by mouth two times daily with a meal Treatment intent: Palliative   Patient Education Counseled patient on administration, dosing, side effects, monitoring, drug-food interactions, safe handling, storage, and disposal. Reviewed with patient importance of keeping a medication schedule and plan for any missed doses. Supportive care and prophylaxis needs were discussed (if applicable).  Relevant drug-drug interactions were discussed (if applicable).  Advise effects include but are not limited to:  Nubeqa  (darolutamide ) Changes in liver function: Labs will be monitored during clinic visit  Decreased white blood cells: Contact the cancer center at [phone] if you develop a fever 100.68F  Orgovyx  (relugolix ) Hot flashes: Inform your healthcare provider if you start of experience intolerable hot flashes Muscle or joint pain: If you develop pain or weakness that impacts your daily life, let your provider know  Changes in electrolytes: Labs will be monitored during clinic visits   Patient Understanding and Adherence After discussion with patient some patient barriers due to complexity of regimen. Pharmacist will be making sure patient is aware of the medications again once we have patient assistance approved.  Patient voiced understanding and appreciation.  All questions answered. Medication handout provided  Communication  and Learning Assessment Primary learner: Brandon [name] Barriers to learning: No barriers Preferred language: English Learning preferences: Listening Reading  Evaluation of Patient Distress Distress thermometer not completed during in person visit as patient is starting on IV therapy.   Follow-up and Contact Patient knows when and how to call the office with questions or concerns about non-urgent side effects.  Provided patient with Oral Chemotherapy Navigation Clinic phone number.  Oral Chemotherapy Navigation Clinic will continue to follow.  Thank you for allowing pharmacy to participate in this patient's care.  Alfonso MARLA Buys, PharmD Pharmacy Resident  12/07/2023 2:10 PM

## 2023-12-07 NOTE — Progress Notes (Signed)
 Oncology Pharmacist Encounter  I spoke with patient as documented below with PGY2 Oncology Pharmacy Resident.   Lupie Sawa, PharmD Hematology/Oncology Clinical Pharmacist [facility] [phone]

## 2023-12-10 ENCOUNTER — Ambulatory Visit: Payer: Self-pay | Admitting: Radiation Oncology

## 2023-12-10 ENCOUNTER — Ambulatory Visit: Payer: Self-pay

## 2023-12-11 ENCOUNTER — Other Ambulatory Visit: Payer: Self-pay | Admitting: *Deleted

## 2023-12-11 MED ORDER — RELUGOLIX 120 MG PO TABS: ORAL_TABLET | ORAL | 0 refills | Status: DC

## 2023-12-11 NOTE — Telephone Encounter (Signed)
 Oral Oncology Patient Advocate Encounter  We got approval from [employer] for his Nubeqa  for patient to receive for free, Orgovyx  rx got resent in to [facility] with a dx code on it for coverage.  Lucie Lamer, CPhT [place]  [facility] Pharmacy Technician Patient Advocate Specialist II [facility] [place] Phone: [phone]  Fax: [phone] [email or web address]

## 2023-12-11 NOTE — Progress Notes (Signed)
 RN spoke with patient to assess any questions prior to upcoming chemotherapy start on Thursday, 9/11.  RN answered questions related to docetaxel .  Patient had several questions related to his oral medications r/t delivery, start, etc.  I have reached out to pharmacy to review.  RN will follow up with patient during 1st chemo.

## 2023-12-12 NOTE — Assessment & Plan Note (Signed)
 Starting triple docetaxel  with Orgovyx  and darolutamide   Social work consult

## 2023-12-12 NOTE — Progress Notes (Signed)
 Called pt to introduce myself as his Dance movement psychotherapist and to discuss the [employer].  I called his cell but his voicemail isn't set up so I left a msg on the home phone requesting he return my call if he's interested in applying for the grant.

## 2023-12-13 ENCOUNTER — Other Ambulatory Visit: Payer: Self-pay

## 2023-12-13 ENCOUNTER — Inpatient Hospital Stay: Payer: Self-pay

## 2023-12-13 VITALS — BP 147/93 | HR 79 | Temp 97.6°F | Resp 18

## 2023-12-13 VITALS — BP 139/85 | HR 88 | Temp 97.2°F | Resp 20 | Wt 143.0 lb

## 2023-12-13 DIAGNOSIS — N179 Acute kidney failure, unspecified: Secondary | ICD-10-CM

## 2023-12-13 DIAGNOSIS — C61 Malignant neoplasm of prostate: Secondary | ICD-10-CM

## 2023-12-13 DIAGNOSIS — D6182 Myelophthisis: Secondary | ICD-10-CM

## 2023-12-13 DIAGNOSIS — Z9189 Other specified personal risk factors, not elsewhere classified: Secondary | ICD-10-CM

## 2023-12-13 LAB — CMP (CANCER CENTER ONLY)
ALT: 17 U/L (ref 0–44)
AST: 16 U/L (ref 15–41)
Albumin: 4.2 g/dL (ref 3.5–5.0)
Alkaline Phosphatase: 87 U/L (ref 38–126)
Anion gap: 9 (ref 5–15)
BUN: 94 mg/dL — ABNORMAL HIGH (ref 8–23)
CO2: 20 mmol/L — ABNORMAL LOW (ref 22–32)
Calcium: 9.1 mg/dL (ref 8.9–10.3)
Chloride: 106 mmol/L (ref 98–111)
Creatinine: 3.52 mg/dL — ABNORMAL HIGH (ref 0.61–1.24)
GFR, Estimated: 19 mL/min — ABNORMAL LOW (ref >60)
Glucose, Bld: 229 mg/dL — ABNORMAL HIGH (ref 70–99)
Potassium: 5.1 mmol/L (ref 3.5–5.1)
Sodium: 135 mmol/L (ref 135–145)
Total Bilirubin: 0.4 mg/dL (ref 0.0–1.2)
Total Protein: 8 g/dL (ref 6.5–8.1)

## 2023-12-13 LAB — CBC WITH DIFFERENTIAL (CANCER CENTER ONLY)
Abs Immature Granulocytes: 0.02 K/uL (ref 0.00–0.07)
Basophils Absolute: 0 K/uL (ref 0.0–0.1)
Basophils Relative: 1 %
Eosinophils Absolute: 0.1 K/uL (ref 0.0–0.5)
Eosinophils Relative: 2 %
HCT: 28.3 % — ABNORMAL LOW (ref 39.0–52.0)
Hemoglobin: 9.4 g/dL — ABNORMAL LOW (ref 13.0–17.0)
Immature Granulocytes: 1 %
Lymphocytes Relative: 17 %
Lymphs Abs: 0.7 K/uL (ref 0.7–4.0)
MCH: 27.7 pg (ref 26.0–34.0)
MCHC: 33.2 g/dL (ref 30.0–36.0)
MCV: 83.5 fL (ref 80.0–100.0)
Monocytes Absolute: 0.3 K/uL (ref 0.1–1.0)
Monocytes Relative: 7 %
Neutro Abs: 3.2 K/uL (ref 1.7–7.7)
Neutrophils Relative %: 72 %
Platelet Count: 346 K/uL (ref 150–400)
RBC: 3.39 MIL/uL — ABNORMAL LOW (ref 4.22–5.81)
RDW: 15.5 % (ref 11.5–15.5)
WBC Count: 4.4 K/uL (ref 4.0–10.5)
nRBC: 0 % (ref 0.0–0.2)

## 2023-12-13 MED ORDER — SODIUM CHLORIDE 0.9 % IV SOLN: INTRAVENOUS | Status: DC

## 2023-12-13 MED ORDER — SODIUM CHLORIDE 0.9 % IV SOLN
75.0000 mg/m2 | Freq: Once | INTRAVENOUS | Status: AC
  Administered 2023-12-13: 139 mg via INTRAVENOUS
  Filled 2023-12-13: qty 13.9

## 2023-12-13 MED ORDER — RELUGOLIX 120 MG PO TABS: ORAL_TABLET | ORAL | 0 refills | Status: DC

## 2023-12-13 MED ORDER — DEXAMETHASONE SODIUM PHOSPHATE 10 MG/ML IJ SOLN
10.0000 mg | Freq: Once | INTRAMUSCULAR | Status: AC
  Administered 2023-12-13: 10 mg via INTRAVENOUS
  Filled 2023-12-13: qty 1

## 2023-12-13 NOTE — Assessment & Plan Note (Addendum)
 CBC, ABORh/SBB and 1 unit of PRBC next week

## 2023-12-13 NOTE — Assessment & Plan Note (Addendum)
 From prostate cancer. Start treatment today Bilateral nephrostomy tube in placed with good urine output Continue to monitor every 3 weeks Recommend increase fluids to 64-70 ounce a day.

## 2023-12-13 NOTE — Patient Instructions (Signed)
 [facility] - A DEPT OF [facility]  Discharge Instructions: Thank you for choosing [facility] to provide your oncology and hematology care.   If you have a lab appointment with the Cancer Center, please go directly to the Cancer Center and check in at the registration area.   Wear comfortable clothing and clothing appropriate for easy access to any Portacath or PICC line.   We strive to give you quality time with your provider. You may need to reschedule your appointment if you arrive late (15 or more minutes).  Arriving late affects you and other patients whose appointments are after yours.  Also, if you miss three or more appointments without notifying the office, you may be dismissed from the clinic at the provider's discretion.      For prescription refill requests, have your pharmacy contact our office and allow 72 hours for refills to be completed.    Today you received the following chemotherapy and/or immunotherapy agents Taxotere.      To help prevent nausea and vomiting after your treatment, we encourage you to take your nausea medication as directed.  BELOW ARE SYMPTOMS THAT SHOULD BE REPORTED IMMEDIATELY: *FEVER GREATER THAN 100.4 F (38 C) OR HIGHER *CHILLS OR SWEATING *NAUSEA AND VOMITING THAT IS NOT CONTROLLED WITH YOUR NAUSEA MEDICATION *UNUSUAL SHORTNESS OF BREATH *UNUSUAL BRUISING OR BLEEDING *URINARY PROBLEMS (pain or burning when urinating, or frequent urination) *BOWEL PROBLEMS (unusual diarrhea, constipation, pain near the anus) TENDERNESS IN MOUTH AND THROAT WITH OR WITHOUT PRESENCE OF ULCERS (sore throat, sores in mouth, or a toothache) UNUSUAL RASH, SWELLING OR PAIN  UNUSUAL VAGINAL DISCHARGE OR ITCHING   Items with * indicate a potential emergency and should be followed up as soon as possible or go to the Emergency Department if any problems should occur.  Please show the CHEMOTHERAPY ALERT CARD or IMMUNOTHERAPY  ALERT CARD at check-in to the Emergency Department and triage nurse.  Should you have questions after your visit or need to cancel or reschedule your appointment, please contact [facility] - A DEPT OF [facility]  Dept: [phone]  and follow the prompts.  Office hours are 8:00 a.m. to 4:30 p.m. Monday - Friday. Please note that voicemails left after 4:00 p.m. may not be returned until the following business day.  We are closed weekends and major holidays. You have access to a nurse at all times for urgent questions. Please call the main number to the clinic Dept: [phone] and follow the prompts.   For any non-urgent questions, you may also contact your provider using MyChart. We now offer e-Visits for anyone 69 and older to request care online for non-urgent symptoms. For details visit [email or web address].   Also download the MyChart app! Go to the app store, search "MyChart", open the app, select [facility], and log in with your MyChart username and password.

## 2023-12-13 NOTE — Progress Notes (Signed)
 RN met with patient during his first chemotherapy.  Patient doing well.  RN provided information that pharmacy will be reaching out to patient once they have confirmed delivery dates of medications for orgovyx  and darolutamide .   All questions answered.  Will continue to follow.

## 2023-12-13 NOTE — Progress Notes (Signed)
 Brandon [name] OFFICE PROGRESS NOTE  Patient Care Team: Patient, No Pcp Per as PCP - General (General Practice) Vertell Pont, RN as Oncology Nurse Navigator  Brandon [name] is a 63 y.o.male with history of IDM2, hyperlipidemia, being seen at [facility] Oncology Clinic for prostate cancer.  Staging showed multifocal skeletal metastases.   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 PSA  107 Germline testing: Referral placed Somatic testing: to be obtain Treatment: Recommend triple therapy Assessment & Plan Malignant neoplasm of prostate (HCC) Starting triple docetaxel   Can start Orgovyx  and darolutamide  anytime he gets them Return for cycle 2 in 3 weeks Referral to genetics Follow-up earlier if new concerns Myelophthisic anemia (HCC) CBC, ABORh/SBB and 1 unit of PRBC next week AKI (acute kidney injury) (HCC) From prostate cancer. Start treatment today Bilateral nephrostomy tube in placed with good urine output Continue to monitor every 3 weeks Recommend increase fluids to 64-70 ounce a day. At risk for adverse drug event calcium  (1000-1200 mg daily from food and supplements) and vitamin D3 (1000 IU daily) Healthy lifestyle to prevent diabetes and CV disease Aggressive cardiovascular risk management Exercise as able when disease is controlled Limit alcohol consumption and avoid smoking   Orders Placed This Encounter  Procedures   CBC with Differential (Cancer [name] Only)    Standing Status:   Future    Expiration Date:   12/12/2024   ABO/Rh    Standing Status:   Future    Expiration Date:   12/12/2024   Sample to Blood Bank    Standing Status:   Future    Expiration Date:   12/12/2024     [name], MD  INTERVAL HISTORY: Patient returns for follow-up.  Overall appetite is not great.  He has not seen new medication yet.  A little bit of lightheadedness.  He is drinking more fluid.  Reported good urine output.   He denies any significant bone pain or back pain.  No chest pain.  Some shortness of breath.  Oncology History  Malignant neoplasm of prostate (HCC)  10/16/2023 Imaging   CT urogram 1.  Moderate to severe bilateral hydroureteronephrosis, favored due to a locally invasive prostate neoplasm as discussed below. Recommend prompt evaluation of renal dysfunction and consideration for percutaneous decompression. Additionally, recommend correlation with PSA and urologic evaluation.  2.  Markedly irregular prostate with enhancing soft tissue infiltrating the posterior bladder wall, seminal vesicles, and extends along the mesorectal fascia. Nodal disease extends to the external iliac chain nodes, however many of the common iliac and para-aortic nodes appear suspicious.  3.  Delayed bilateral nephrograms with no contrast excretion on delayed phase in the left kidney, indicative of compromised renal function.  4.  Sclerotic osseous metastatic disease.    10/30/2023 Initial Diagnosis   Malignant neoplasm of prostate (HCC)   11/01/2023 Cancer Staging   Staging form: Prostate, AJCC 8th Edition - Clinical stage from 11/01/2023: Stage IVB (cT2c, cN0, pM1b, PSA: 107, Grade Group: 4) - Signed by Sherwood Rise, PA-C on 12/04/2023 Histopathologic type: Adenocarcinoma, NOS Stage prefix: Initial diagnosis Prostate specific antigen (PSA) range: 20 or greater Gleason primary pattern: 4 Gleason secondary pattern: 4 Gleason score: 8 Histologic grading system: 5 grade system Number of biopsy cores examined: 10 Number of biopsy cores positive: 10 Location of positive needle core biopsies: Both sides   11/01/2023 Pathology Results   Biopsy from [facility]. A. PROSTATE, LEFT APEX, CORE BIOPSY:  Prostatic adenocarcinoma, Grade group 4 (Gleason score 4+4=8), involving approximately 10% of the prostatic tissue. Intraductal carcinoma present.               B. PROSTATE, LEFT MID, CORE BIOPSY:               Prostatic  adenocarcinoma, Grade group 4 (Gleason score 4+4=8),  involving approximately 25% of the prostatic tissue; Cribriform glands present. Atypical intraductal proliferation.               C. PROSTATE, LEFT BASE, CORE BIOPSY:               Prostatic adenocarcinoma, Grade group 4 (Gleason score 4+4=8),  involving 2 of 2 cores and 60-70% of the prostatic tissue; Cribriform glands present. Intraductal carcinoma present.              Perineural invasion identified.   D. PROSTATE, RIGHT APEX, CORE BIOPSY:               Prostatic adenocarcinoma, Grade group 3 (Gleason score 4+3=7), involving 2 of 2 cores and 15% of the prostatic tissue.   E. PROSTATE, RIGHT MID, CORE BIOPSY:               Prostatic adenocarcinoma, Grade group 3 (Gleason grade 4+3=7),  involving 2 of 2 cores and 10-15% of the prostatic tissue.              Intraductal carcinoma present. Perineural invasion identified.   F. PROSTATE, RIGHT BASE, CORE BIOPSY:                Prostatic adenocarcinoma, Grade group 3 (Gleason score 4+3=7), involving 2 of 2 cores and 20-25% of the prostatic tissue.              Intraductal carcinoma present.              Perineural invasion identified.   11/15/2023 PET scan   PSMA  PET 1. Multifocal intense radiotracer avid skeletal metastasis within the axillary skeleton.  2. No evidence of nodal metastasis. Enlarged LEFT external iliac node does not have significant radiotracer activity  3. No evidence of visceral metastasis.  4. Mild nonfocal radiotracer activity in the prostate gland.  5. Bilateral pleural effusions.    12/11/2023 -  Chemotherapy   Patient is on Treatment Plan : PROSTATE Docetaxel  (75) + Prednisone  q21d        PHYSICAL EXAMINATION: ECOG PERFORMANCE STATUS: 2 - Symptomatic, <50% confined to bed  Vitals:   12/13/23 1240  BP: 139/85  Pulse: 88  Resp: 20  Temp: (!) 97.2 F (36.2 C)  SpO2: 100%   Filed Weights   12/13/23 1240  Weight: 143 lb (64.9 kg)    GENERAL:  alert, no distress and comfortable LUNGS: clear to auscultation and percussion with normal breathing effort HEART: regular rate & rhythm  ABDOMEN: abdomen soft, non-tender and nondistended. Musculoskeletal: no edema NEURO: no focal motor/sensory deficits  Relevant data reviewed during this visit included labs.

## 2023-12-13 NOTE — Assessment & Plan Note (Addendum)
 calcium  (1000-1200 mg daily from food and supplements) and vitamin D3 (1000 IU daily) Healthy lifestyle to prevent diabetes and CV disease Aggressive cardiovascular risk management Exercise as able when disease is controlled Limit alcohol consumption and avoid smoking

## 2023-12-14 ENCOUNTER — Telehealth: Payer: Self-pay

## 2023-12-14 NOTE — Telephone Encounter (Signed)
-----   Message from Nurse Selinda E sent at 12/13/2023  3:40 PM EDT ----- Regarding: first time call backGLENWOOD Chihuahua, Taxotere  Brandon [name] received taxotere  PIV today for the first time. He is followed by Dr. Chihuahua. He tolerated treatment well with no issues. Expecting your call :)

## 2023-12-14 NOTE — Telephone Encounter (Signed)
 Scheduled appointments per WQ. Talked with the patient and he is aware of the made appointments.

## 2023-12-15 ENCOUNTER — Inpatient Hospital Stay: Payer: Self-pay

## 2023-12-15 VITALS — BP 140/82 | HR 99 | Temp 97.9°F | Resp 18

## 2023-12-15 DIAGNOSIS — C61 Malignant neoplasm of prostate: Secondary | ICD-10-CM

## 2023-12-15 MED ORDER — PEGFILGRASTIM-CBQV 6 MG/0.6ML ~~LOC~~ SOSY
6.0000 mg | PREFILLED_SYRINGE | Freq: Once | SUBCUTANEOUS | Status: AC
  Administered 2023-12-15: 6 mg via SUBCUTANEOUS
  Filled 2023-12-15: qty 0.6

## 2023-12-16 ENCOUNTER — Other Ambulatory Visit: Payer: Self-pay

## 2023-12-17 ENCOUNTER — Telehealth: Payer: Self-pay

## 2023-12-17 NOTE — Telephone Encounter (Signed)
 Oral Oncology Patient Advocate Encounter  Submitted application for assistance for Orgovyx  through Orgovyx  Support Program, they have received the rx and will begin BIV. Patient aware to be on the lookout for a phone call from them. Orgovyx  Support Program phone number [phone].    Lucie Lamer, CPhT [place]  [facility] Pharmacy Technician Patient Advocate Specialist II [place] Phone: [phone]  Fax: [phone] [email or web address]

## 2023-12-18 ENCOUNTER — Other Ambulatory Visit: Payer: Self-pay

## 2023-12-18 DIAGNOSIS — C61 Malignant neoplasm of prostate: Secondary | ICD-10-CM

## 2023-12-18 NOTE — Progress Notes (Signed)
 Spoke w/ pt's wife regarding the [employer].  Pt would like to apply so I emailed her an expense sheet and since he's not receiving income his spouse will provide her proof of income.  If he qualifies I will approve him for the grant.

## 2023-12-18 NOTE — Telephone Encounter (Signed)
 Oncology Pharmacist Encounter  Discussed with patients wife the new medications including for patient to take the prednisone  daily and the dexamethasone  in addition on the day before the docetaxel  and the two days after the docetaxel . Discussed also the Nubeqa  and Orgovyx . Patient has started the Nubeqa  and we are waiting on the Orgovyx  to be approved through patient assistance. A calendar was sent to the patient over their mychart messages in order for them to keep track of the medications for the day.   Helaine Yackel, PharmD Hematology/Oncology Clinical Pharmacist [facility] Navigation Clinic [phone]

## 2023-12-18 NOTE — Progress Notes (Signed)
Pt is also approved for the $200 Prostate grant.

## 2023-12-18 NOTE — Progress Notes (Signed)
 Pt is approved for the $1000 [employer].

## 2023-12-19 ENCOUNTER — Inpatient Hospital Stay: Payer: Self-pay

## 2023-12-19 ENCOUNTER — Other Ambulatory Visit: Payer: Self-pay

## 2023-12-19 ENCOUNTER — Ambulatory Visit: Payer: Self-pay

## 2023-12-19 DIAGNOSIS — C61 Malignant neoplasm of prostate: Secondary | ICD-10-CM

## 2023-12-19 LAB — CBC WITH DIFFERENTIAL (CANCER CENTER ONLY)
Abs Immature Granulocytes: 0.7 K/uL — ABNORMAL HIGH (ref 0.00–0.07)
Band Neutrophils: 14 %
Basophils Absolute: 0 K/uL (ref 0.0–0.1)
Basophils Relative: 0 %
Eosinophils Absolute: 0 K/uL (ref 0.0–0.5)
Eosinophils Relative: 0 %
HCT: 28.8 % — ABNORMAL LOW (ref 39.0–52.0)
Hemoglobin: 9.5 g/dL — ABNORMAL LOW (ref 13.0–17.0)
Lymphocytes Relative: 16 %
Lymphs Abs: 0.8 K/uL (ref 0.7–4.0)
MCH: 26.8 pg (ref 26.0–34.0)
MCHC: 33 g/dL (ref 30.0–36.0)
MCV: 81.4 fL (ref 80.0–100.0)
Metamyelocytes Relative: 12 %
Monocytes Absolute: 0.2 K/uL (ref 0.1–1.0)
Monocytes Relative: 4 %
Myelocytes: 3 %
Neutro Abs: 3.2 K/uL (ref 1.7–7.7)
Neutrophils Relative %: 51 %
Platelet Count: 262 K/uL (ref 150–400)
RBC: 3.54 MIL/uL — ABNORMAL LOW (ref 4.22–5.81)
RDW: 14.6 % (ref 11.5–15.5)
WBC Count: 4.9 K/uL (ref 4.0–10.5)
nRBC: 0 % (ref 0.0–0.2)

## 2023-12-19 LAB — ABO/RH: ABO/RH(D): O POS

## 2023-12-19 LAB — SAMPLE TO BLOOD BANK

## 2023-12-19 NOTE — Progress Notes (Signed)
 [facility] CSW Progress Note  Clinical Social Worker attempted to see patient on 9/17 following lab appointment. Patient did not show for appointment. SNAP application placed in the mails.      Lizbeth Sprague, LCSW Clinical Social Worker [facility]

## 2023-12-19 NOTE — Progress Notes (Signed)
 Opened in error

## 2023-12-21 NOTE — Progress Notes (Signed)
 RN spoke with patient to follow up to ensure no questions related to current treatment.  RN provided education on chemotherapy side effects.  All questions answered.  Will continue to follow.

## 2023-12-24 ENCOUNTER — Inpatient Hospital Stay: Payer: Self-pay

## 2023-12-24 NOTE — Progress Notes (Signed)
 [facility] CSW Progress Note  Visual merchandiser spoke with patient's spouse via telephone regarding questions about financial assistance. Patient's spouse inquired about additional financial assistance for medical treatment.  CSW discussed Cancer [facility], message sent with number.  CSW discussed [facility] Assistance, message sent with application and number. CSW reviewed prostate groups, no funds available CSW confirmed Medicaid referral has been sent.  CSW confirmed SNAP application has been mailed.   Follow Up Plan:  Patient will contact CSW with any support or resource needs    Lizbeth Sprague, LCSW Clinical Social Worker [facility]

## 2023-12-25 ENCOUNTER — Telehealth: Payer: Self-pay

## 2023-12-25 ENCOUNTER — Telehealth: Payer: Self-pay | Admitting: *Deleted

## 2023-12-25 DIAGNOSIS — C61 Malignant neoplasm of prostate: Secondary | ICD-10-CM

## 2023-12-25 MED ORDER — DAROLUTAMIDE 300 MG PO TABS: 300.0000 mg | ORAL_TABLET | Freq: Two times a day (BID) | ORAL | Status: AC

## 2023-12-25 NOTE — Telephone Encounter (Signed)
 Wife states patient continues to have weakness when he stands. States it has been this way the whole time he has been coming to see Dr Tina. He is drinking ~ 6 glasses of green tea or water daily. Encouraged to increase to at least 8 glasses per day. Wife asked about calcium  and vitamin D supplements. Was told that he can take OTC calcium  and vitamin D. Pt was in the background asking about using oxygen as he feels dizzy when he get up. O2 sat at last visit was 100%. Encouraged to ambulate with assistance or using cane. Pt states he has a rolling walker but it doesn't work on carpet.   Pharmacist, Kaitlyn Schomberg was in on the phone call. She instructed him to decrease Nubeqa  to 1 tablet twice a day rather than 2 tablets BID. Wife verbalized understanding.

## 2023-12-25 NOTE — Telephone Encounter (Signed)
 Oncology Pharmacist Encounter  Patients wife called RN. Patients renal function is continuing to increase even though patients wife states that he is drinking water per discussion with RN. Patients wife knows to only take the nubeqa  1 tablet BID and if renal function recovers closer to baseline we can increase to 2 tablets BID. MD aware.   Alexander Mcauley, PharmD Hematology/Oncology Clinical Pharmacist [facility] [phone]

## 2023-12-26 NOTE — Progress Notes (Signed)
 The following biosimilar Stimufend  (pegfilgrastim -fpgk) has been selected for use in this patient.  Grahm Etsitty, Pharm.D., CPP 12/26/2023@10 :14 AM

## 2023-12-27 ENCOUNTER — Telehealth: Payer: Self-pay | Admitting: *Deleted

## 2023-12-27 NOTE — Telephone Encounter (Signed)
 Oral Oncology Patient Advocate Encounter   Received notification that the application for assistance for Orgovyx  through Orgovyx  Support Program  has been approved.   Orgov[employer]  Support Program  phone number [phone].   Effective dates: 12/26/23 through 12/24/24.  Medication will be filled at [facility].  I have spoken to the patient.  Lucie Lamer, CPhT [place]  [facility] Services Pharmacy Technician Patient Advocate Specialist II [place] Phone: [phone]  Fax: [phone] [email or web address]

## 2023-12-27 NOTE — Telephone Encounter (Signed)
 TCT patient regarding his feelings of weakness he was experiencing earlier in the week, Spoke to him. He states he is feeling better. He states he is moving slow but not as week. He states he is drinking adequate fluids and does not feel like he needs to come in to the cancer center for IV fluids today. Reminded pt of his appt for next week-01/03/24. And also reminded pt to call if he starts feeling worse-more weakness, dizzy, SOB  etc. Pt voiced understanding

## 2024-01-01 ENCOUNTER — Other Ambulatory Visit: Payer: Self-pay

## 2024-01-01 DIAGNOSIS — C61 Malignant neoplasm of prostate: Secondary | ICD-10-CM

## 2024-01-01 NOTE — Assessment & Plan Note (Signed)
 calcium  (1000-1200 mg daily from food and supplements) and vitamin D3 (1000 IU daily) Healthy lifestyle to prevent diabetes and CV disease Aggressive cardiovascular risk management Exercise as able when disease is controlled Limit alcohol consumption and avoid smoking

## 2024-01-01 NOTE — Progress Notes (Unsigned)
 Brandon [name], Brandon Pcp Per as PCP - General (General Practice) Brandon [name], Brandon [name]  Brandon [name] is a 63 y.o.male with history of IDM2, hyperlipidemia, [facility].  Staging showed multifocal skeletal metastases.   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 PSA  107 Germline testing: Referral placed Somatic testing: to be obtain Treatment: 12/13/23 started docetaxel  triple therapy  Borderline leukocytosis with recent G-CSF.  Brandon fevers or signs of infection.  Anemia stable.  Brandon need for transfusion today.  Clinically feeling better for the first time this week. Assessment & Plan Malignant neoplasm of prostate (HCC) continue triplet therapy with docetaxel , Orgovyx  and darolutamide   Return for cycle 3 in 3 weeks Follow-up with genetics Repeat imaging in about cycle 4 Follow-up earlier if new concerns Myelophthisic anemia (HCC) CBC showed stable hemoglobin at 9.1 today At risk for adverse drug event calcium  (1000-1200 mg daily from food and supplements) and vitamin D3 (1000 IU daily) Healthy lifestyle to prevent diabetes and CV disease Aggressive cardiovascular risk management Exercise as able when disease is controlled Limit alcohol consumption and avoid smoking  Weight loss We will increase protein in the diet along with variety of vegetables, fruits. AKI (acute kidney injury) Bilateral nephrostomy tube in place.  Reported good urine output and starting to urinate normally. Continue 64 ounce of fluid daily Has follow-up with IR at [facility] for nephrostomy tube exchange.    Brandon JAYSON Chihuahua, MD  INTERVAL HISTORY: [name] returns for follow-up.  Feeling better for the first time this week.  Appetite improved.  Report of mild hand neuropathy, a history of diabetes.  Brandon fever or chills or signs of  infection.  Able to urinate a little bit normally.  Nephrostomy tubes are draining well.  Report he is drinking more fluid.  Oncology History  Malignant neoplasm of prostate (HCC)  10/16/2023 Imaging   CT urogram 1.  Moderate to severe bilateral hydroureteronephrosis, favored due to a locally invasive prostate neoplasm as discussed below. Recommend prompt evaluation of renal dysfunction and consideration for percutaneous decompression. Additionally, recommend correlation with PSA and urologic evaluation.  2.  Markedly irregular prostate with enhancing soft tissue infiltrating the posterior bladder wall, seminal vesicles, and extends along the mesorectal fascia. Nodal disease extends to the external iliac chain nodes, however many of the common iliac and para-aortic nodes appear suspicious.  3.  Delayed bilateral nephrograms with Brandon contrast excretion on delayed phase in the left kidney, indicative of compromised renal function.  4.  Sclerotic osseous metastatic disease.    10/30/2023 Initial Diagnosis   Malignant neoplasm of prostate (HCC)   11/01/2023 Cancer Staging   Staging form: Prostate, AJCC 8th Edition - Clinical stage from 11/01/2023: Stage IVB (cT2c, cN0, pM1b, PSA: 107, Grade Group: 4) - Signed by Sherwood Rise, PA-C on 12/04/2023 Histopathologic type: Adenocarcinoma, NOS Stage prefix: Initial diagnosis Prostate specific antigen (PSA) range: 20 or greater Gleason primary pattern: 4 Gleason secondary pattern: 4 Gleason score: 8 Histologic grading system: 5 grade system Number of biopsy cores examined: 10 Number of biopsy cores positive: 10 Location of positive needle core biopsies: Both sides   11/01/2023 Pathology Results   Biopsy from [facility]. A. PROSTATE, LEFT APEX, CORE BIOPSY:  Prostatic adenocarcinoma, Grade group 4 (Gleason score 4+4=8), involving approximately 10% of the prostatic tissue. Intraductal carcinoma present.  B. PROSTATE, LEFT MID, CORE BIOPSY:                Prostatic adenocarcinoma, Grade group 4 (Gleason score 4+4=8),  involving approximately 25% of the prostatic tissue; Cribriform glands present. Atypical intraductal proliferation.               C. PROSTATE, LEFT BASE, CORE BIOPSY:               Prostatic adenocarcinoma, Grade group 4 (Gleason score 4+4=8),  involving 2 of 2 cores and 60-70% of the prostatic tissue; Cribriform glands present. Intraductal carcinoma present.              Perineural invasion identified.   D. PROSTATE, RIGHT APEX, CORE BIOPSY:               Prostatic adenocarcinoma, Grade group 3 (Gleason score 4+3=7), involving 2 of 2 cores and 15% of the prostatic tissue.   E. PROSTATE, RIGHT MID, CORE BIOPSY:               Prostatic adenocarcinoma, Grade group 3 (Gleason grade 4+3=7),  involving 2 of 2 cores and 10-15% of the prostatic tissue.              Intraductal carcinoma present. Perineural invasion identified.   F. PROSTATE, RIGHT BASE, CORE BIOPSY:                Prostatic adenocarcinoma, Grade group 3 (Gleason score 4+3=7), involving 2 of 2 cores and 20-25% of the prostatic tissue.              Intraductal carcinoma present.              Perineural invasion identified.   11/15/2023 PET scan   PSMA  PET 1. Multifocal intense radiotracer avid skeletal metastasis within the axillary skeleton.  2. Brandon evidence of nodal metastasis. Enlarged LEFT external iliac node does not have significant radiotracer activity  3. Brandon evidence of visceral metastasis.  4. Mild nonfocal radiotracer activity in the prostate gland.  5. Bilateral pleural effusions.    12/13/2023 -  Chemotherapy   [name] is on Treatment Plan : PROSTATE Docetaxel  (75) + Prednisone  q21d        PHYSICAL EXAMINATION: ECOG PERFORMANCE STATUS: 1 - Symptomatic but completely ambulatory  Vitals:   01/03/24 1329 01/03/24 1330  BP: (!) 167/91 (!) 167/90  Pulse: 87   Resp: 20   Temp: (!) 97.5 F (36.4 C)   SpO2: 100%    Filed Weights    01/03/24 1329  Weight: 142 lb 6.4 oz (64.6 kg)    GENERAL: alert, Brandon distress and comfortable OROPHARYNX: Dry LUNGS: clear to auscultation and Brandon wheeze or rales with normal breathing effort HEART: regular rate & rhythm  ABDOMEN: abdomen soft, non-tender and nondistended. Bilateral nephrostomy tube Musculoskeletal: Brandon edema   Relevant data reviewed during this visit included labs.  New labs ordered.

## 2024-01-01 NOTE — Assessment & Plan Note (Signed)
 continue triplet therapy with docetaxel , Orgovyx  and darolutamide   Return for cycle 3 in 3 weeks Referral to genetics Follow-up earlier if new concerns

## 2024-01-01 NOTE — Assessment & Plan Note (Signed)
 CBC showed stable hemoglobin at 9.1 today

## 2024-01-02 ENCOUNTER — Telehealth: Payer: Self-pay

## 2024-01-02 NOTE — Telephone Encounter (Signed)
 [facility] CSW Progress Note  Visual merchandiser (CSW) contacted patient to confirm [employer] documents were received. CSW submitted them to the [facility] Portal on this date. Patient is aware if no status is given by November to call DSS. Copy sent to patient's email as requested. No other needs at this time.     Lizbeth Sprague, LCSW Clinical Social Worker [facility]

## 2024-01-03 ENCOUNTER — Inpatient Hospital Stay: Payer: Self-pay

## 2024-01-03 ENCOUNTER — Inpatient Hospital Stay (HOSPITAL_BASED_OUTPATIENT_CLINIC_OR_DEPARTMENT_OTHER): Payer: Self-pay

## 2024-01-03 VITALS — BP 167/90 | HR 87 | Temp 97.5°F | Resp 20 | Wt 142.4 lb

## 2024-01-03 DIAGNOSIS — N1330 Unspecified hydronephrosis: Secondary | ICD-10-CM | POA: Insufficient documentation

## 2024-01-03 DIAGNOSIS — D6182 Myelophthisis: Secondary | ICD-10-CM | POA: Insufficient documentation

## 2024-01-03 DIAGNOSIS — Z79899 Other long term (current) drug therapy: Secondary | ICD-10-CM | POA: Insufficient documentation

## 2024-01-03 DIAGNOSIS — C61 Malignant neoplasm of prostate: Secondary | ICD-10-CM | POA: Insufficient documentation

## 2024-01-03 DIAGNOSIS — R634 Abnormal weight loss: Secondary | ICD-10-CM

## 2024-01-03 DIAGNOSIS — Z9189 Other specified personal risk factors, not elsewhere classified: Secondary | ICD-10-CM

## 2024-01-03 DIAGNOSIS — N179 Acute kidney failure, unspecified: Secondary | ICD-10-CM

## 2024-01-03 DIAGNOSIS — Z5111 Encounter for antineoplastic chemotherapy: Secondary | ICD-10-CM | POA: Insufficient documentation

## 2024-01-03 DIAGNOSIS — E1140 Type 2 diabetes mellitus with diabetic neuropathy, unspecified: Secondary | ICD-10-CM | POA: Insufficient documentation

## 2024-01-03 DIAGNOSIS — Z79633 Long term (current) use of mitotic inhibitor: Secondary | ICD-10-CM | POA: Insufficient documentation

## 2024-01-03 DIAGNOSIS — Z5189 Encounter for other specified aftercare: Secondary | ICD-10-CM | POA: Insufficient documentation

## 2024-01-03 DIAGNOSIS — M898X9 Other specified disorders of bone, unspecified site: Secondary | ICD-10-CM | POA: Insufficient documentation

## 2024-01-03 DIAGNOSIS — C7951 Secondary malignant neoplasm of bone: Secondary | ICD-10-CM | POA: Insufficient documentation

## 2024-01-03 DIAGNOSIS — R42 Dizziness and giddiness: Secondary | ICD-10-CM | POA: Insufficient documentation

## 2024-01-03 LAB — CMP (CANCER CENTER ONLY)
ALT: 5 U/L (ref 0–44)
AST: 8 U/L — ABNORMAL LOW (ref 15–41)
Albumin: 3.7 g/dL (ref 3.5–5.0)
Alkaline Phosphatase: 104 U/L (ref 38–126)
Anion gap: 13 (ref 5–15)
BUN: 127 mg/dL — ABNORMAL HIGH (ref 8–23)
CO2: 13 mmol/L — ABNORMAL LOW (ref 22–32)
Calcium: 9.1 mg/dL (ref 8.9–10.3)
Chloride: 101 mmol/L (ref 98–111)
Creatinine: 4.4 mg/dL — ABNORMAL HIGH (ref 0.61–1.24)
GFR, Estimated: 14 mL/min — ABNORMAL LOW (ref >60)
Glucose, Bld: 399 mg/dL — ABNORMAL HIGH (ref 70–99)
Potassium: 4.7 mmol/L (ref 3.5–5.1)
Sodium: 127 mmol/L — ABNORMAL LOW (ref 135–145)
Total Bilirubin: 0.3 mg/dL (ref 0.0–1.2)
Total Protein: 8 g/dL (ref 6.5–8.1)

## 2024-01-03 LAB — CBC WITH DIFFERENTIAL (CANCER CENTER ONLY)
Abs Immature Granulocytes: 0.19 K/uL — ABNORMAL HIGH (ref 0.00–0.07)
Basophils Absolute: 0 K/uL (ref 0.0–0.1)
Basophils Relative: 0 %
Eosinophils Absolute: 0 K/uL (ref 0.0–0.5)
Eosinophils Relative: 0 %
HCT: 27.4 % — ABNORMAL LOW (ref 39.0–52.0)
Hemoglobin: 9.1 g/dL — ABNORMAL LOW (ref 13.0–17.0)
Immature Granulocytes: 1 %
Lymphocytes Relative: 3 %
Lymphs Abs: 0.5 K/uL — ABNORMAL LOW (ref 0.7–4.0)
MCH: 26.8 pg (ref 26.0–34.0)
MCHC: 33.2 g/dL (ref 30.0–36.0)
MCV: 80.6 fL (ref 80.0–100.0)
Monocytes Absolute: 0.7 K/uL (ref 0.1–1.0)
Monocytes Relative: 5 %
Neutro Abs: 12.9 K/uL — ABNORMAL HIGH (ref 1.7–7.7)
Neutrophils Relative %: 91 %
Platelet Count: 457 K/uL — ABNORMAL HIGH (ref 150–400)
RBC: 3.4 MIL/uL — ABNORMAL LOW (ref 4.22–5.81)
RDW: 16.6 % — ABNORMAL HIGH (ref 11.5–15.5)
WBC Count: 14.3 K/uL — ABNORMAL HIGH (ref 4.0–10.5)
nRBC: 0 % (ref 0.0–0.2)

## 2024-01-03 LAB — PSA: Prostatic Specific Antigen: 1.9 ng/mL (ref 0.00–4.00)

## 2024-01-03 MED ORDER — SODIUM CHLORIDE 0.9 % IV SOLN
75.0000 mg/m2 | Freq: Once | INTRAVENOUS | Status: AC
  Administered 2024-01-03: 139 mg via INTRAVENOUS
  Filled 2024-01-03: qty 13.9

## 2024-01-03 MED ORDER — INSULIN ASPART 100 UNIT/ML IJ SOLN
10.0000 [IU] | Freq: Once | INTRAMUSCULAR | Status: AC
  Administered 2024-01-03: 10 [IU] via SUBCUTANEOUS
  Filled 2024-01-03: qty 1

## 2024-01-03 MED ORDER — DEXAMETHASONE SODIUM PHOSPHATE 10 MG/ML IJ SOLN
10.0000 mg | Freq: Once | INTRAMUSCULAR | Status: AC
  Administered 2024-01-03: 10 mg via INTRAVENOUS
  Filled 2024-01-03: qty 1

## 2024-01-03 MED ORDER — SODIUM CHLORIDE 0.9 % IV SOLN: INTRAVENOUS | Status: DC

## 2024-01-03 NOTE — Patient Instructions (Signed)
 [facility] - A DEPT OF [facility]  Discharge Instructions: Thank you for choosing [facility] to provide your oncology and hematology care.   If you have a lab appointment with the Cancer Center, please go directly to the Cancer Center and check in at the registration area.   Wear comfortable clothing and clothing appropriate for easy access to any Portacath or PICC line.   We strive to give you quality time with your provider. You may need to reschedule your appointment if you arrive late (15 or more minutes).  Arriving late affects you and other patients whose appointments are after yours.  Also, if you miss three or more appointments without notifying the office, you may be dismissed from the clinic at the provider's discretion.      For prescription refill requests, have your pharmacy contact our office and allow 72 hours for refills to be completed.    Today you received the following chemotherapy and/or immunotherapy agents Taxotere.      To help prevent nausea and vomiting after your treatment, we encourage you to take your nausea medication as directed.  BELOW ARE SYMPTOMS THAT SHOULD BE REPORTED IMMEDIATELY: *FEVER GREATER THAN 100.4 F (38 C) OR HIGHER *CHILLS OR SWEATING *NAUSEA AND VOMITING THAT IS NOT CONTROLLED WITH YOUR NAUSEA MEDICATION *UNUSUAL SHORTNESS OF BREATH *UNUSUAL BRUISING OR BLEEDING *URINARY PROBLEMS (pain or burning when urinating, or frequent urination) *BOWEL PROBLEMS (unusual diarrhea, constipation, pain near the anus) TENDERNESS IN MOUTH AND THROAT WITH OR WITHOUT PRESENCE OF ULCERS (sore throat, sores in mouth, or a toothache) UNUSUAL RASH, SWELLING OR PAIN  UNUSUAL VAGINAL DISCHARGE OR ITCHING   Items with * indicate a potential emergency and should be followed up as soon as possible or go to the Emergency Department if any problems should occur.  Please show the CHEMOTHERAPY ALERT CARD or IMMUNOTHERAPY  ALERT CARD at check-in to the Emergency Department and triage nurse.  Should you have questions after your visit or need to cancel or reschedule your appointment, please contact [facility] - A DEPT OF [facility]  Dept: [phone]  and follow the prompts.  Office hours are 8:00 a.m. to 4:30 p.m. Monday - Friday. Please note that voicemails left after 4:00 p.m. may not be returned until the following business day.  We are closed weekends and major holidays. You have access to a nurse at all times for urgent questions. Please call the main number to the clinic Dept: [phone] and follow the prompts.   For any non-urgent questions, you may also contact your provider using MyChart. We now offer e-Visits for anyone 69 and older to request care online for non-urgent symptoms. For details visit [email or web address].   Also download the MyChart app! Go to the app store, search "MyChart", open the app, select [facility], and log in with your MyChart username and password.

## 2024-01-03 NOTE — Assessment & Plan Note (Addendum)
 Bilateral nephrostomy tube in place.  Reported good urine output and starting to urinate normally. Continue 64 ounce of fluid daily Has follow-up with IR at [facility] for nephrostomy tube exchange.

## 2024-01-03 NOTE — Progress Notes (Signed)
 BG 399 - okay to give dexamethasone  premed and give 10 units of insulin  per Dr. Tina.  Harlene Nasuti, PharmD Oncology Infusion Pharmacist 01/03/2024 2:37 PM

## 2024-01-03 NOTE — Progress Notes (Signed)
 Patient is established with a treatment plan and is actively engaged in care. Nurse Navigator services not currently indicated at this time. Will re-evaluate if needs change or if additional support is requested.

## 2024-01-03 NOTE — Assessment & Plan Note (Addendum)
 We will increase protein in the diet along with variety of vegetables, fruits.

## 2024-01-04 LAB — TESTOSTERONE: Testosterone: 156 ng/dL — ABNORMAL LOW (ref 264–916)

## 2024-01-05 ENCOUNTER — Inpatient Hospital Stay: Payer: Self-pay

## 2024-01-05 VITALS — BP 131/92 | HR 106 | Temp 97.5°F | Resp 17

## 2024-01-05 DIAGNOSIS — C61 Malignant neoplasm of prostate: Secondary | ICD-10-CM

## 2024-01-05 MED ORDER — PEGFILGRASTIM-FPGK 6 MG/0.6ML ~~LOC~~ SOSY
6.0000 mg | PREFILLED_SYRINGE | Freq: Once | SUBCUTANEOUS | Status: AC
  Administered 2024-01-05: 6 mg via SUBCUTANEOUS
  Filled 2024-01-05: qty 0.6

## 2024-01-16 ENCOUNTER — Other Ambulatory Visit: Payer: Self-pay

## 2024-01-17 ENCOUNTER — Telehealth: Payer: Self-pay

## 2024-01-17 NOTE — Telephone Encounter (Signed)
 Our secretary had called the patient to confirm his attendance for his genetic counseling appointment and he had requested a call back about this appointment. I spoke with Dr. Tina and it was determined that Dr. Tina and Brandon [name] would discuss the reason for genetic counseling and genetic testing at his next appointment with Dr. Tina. His genetic counseling appointment on 01/17/24 will be canceled.

## 2024-01-18 ENCOUNTER — Inpatient Hospital Stay: Payer: Self-pay

## 2024-01-18 ENCOUNTER — Other Ambulatory Visit (HOSPITAL_COMMUNITY): Payer: Self-pay

## 2024-01-18 NOTE — Telephone Encounter (Signed)
 Copied from CRM #[id number]. Topic: Medication/Rx - Rx Refill >> Jan 18, 2024  1:46 PM Brandon [name] wrote: Brandon [name], Brandon [name] called to request a medication listed on medication list.Medication is on med list and refill has not been requested. Patient refused to contact pharmacy, declined appointment or pharmacy requested message to provider. Sent message to Clinical Pool and advised caller of 3 business SLA; marked high priority if caller stated they are out of medication. >> Jan 18, 2024  1:50 PM Brandon [name] wrote: Brandon [name], Brandon [name] is calling for medication request (Obtain: Medication Name, Dosage, Quantity, Frequency, Quantity Requested (example: 30-day supply.)   Include all details related to the request([name]) below:  Brandon [name] calling stated the patient was prescribed Basaglar instead of lantus which he was prescribed previously and would like it switched back.  Confirm and type the Best Contact Number below:  Patient/caller contact number:     Brandon [name]      [phone]   [] Home  [] Mobile  [] Work  []  Other   [x]  Okay to leave a voicemail   Medication List:  Current Outpatient Medications:  .  atorvastatin  (LIPITOR) 20 mg tablet, Take 1 tablet (20 mg total) by mouth at bedtime., Disp: 90 tablet, Rfl: 3 .  bicalutamide (CASODEX) 50 mg tablet, Take 1 tablet (50 mg total) by mouth daily. Take at the same time evyerday.  Take with or without food. (Patient not taking: Reported on 01/10/2024), Disp: 30 tablet, Rfl: 2 .  darolutamide  (NUBEQA ) 300 mg tablet, Take 300 mg by mouth., Disp: , Rfl:  .  dexAMETHasone  (DECADRON ) 4 mg tablet, TAKE 2 TABLET BY MOUTH TWICE DAILY STARTING DAY BEFORE CHEMO. THEN TAKE 2 TABLET DAILY FOR 2 DAYS AFTER CHEMO. TAKE WITH FOOD, Disp: , Rfl:  .  gabapentin 100 mg tab, Take 1 tablet (100 mg total) by mouth daily., Disp: , Rfl:  .  glucose blood test strip, Test Strips - Appropriate for meter, ACHS (before meals and at bedtime), ICD-10 Code: Type 2 - E11.9, Disp: 100  each, Rfl: 0 .  glucose monitoring kit kit, Glucometer - Preferred brand, Daily, Check blood sugar before meals and at bedtime, ICD-10 Code: Type 2 - E11.9, Disp: 1 each, Rfl: 0 .  insulin  glargine (Basaglar KwikPen U-100 Insulin ) 100 unit/mL (3 mL) pen, Inject 20 Units under the skin nightly. D: GROUP: BIN: PCN: [id number] SSN, Disp: 15 mL, Rfl: 3 .  insulin  lispro (HumaLOG KwikPen) 100 unit/mL KwikPen, Inject four times per day with meals and bedtime per the sliding scale below: If BG 70-140: 0 Units  If BG 141-170: 1 Units If BG 171-200: 2 Units If BG 201-230: 3 Units If BG 231-260: 4 Units If BG 261-290: 5 Units If BG 291-320: 6 Units  If BG 321-350: 7 Units If BG 351-380: 8 Units If BG >380: 10 units D: GROUP: BIN: PCN: [id number] SSN, Disp: 15 mL, Rfl: 0 .  lancets 30 gauge misc, Lancets, ACHS (before meals and at bedtime), ICD-10 Code: Type 2 - E11.9, Disp: 100 each, Rfl: 0 .  ondansetron  (ZOFRAN ) 8 mg tablet, Take 8 mg by mouth every 8 (eight) hours as needed., Disp: , Rfl:  .  pen needle, diabetic 31 gauge x 5/16 ndle, Pen Needles: 6 mm, Inject insulin  5 times a day, ICD-10 Code: Diabetes Diagnosis Code: Type 2 - E11.9, Disp: 100 each, Rfl: 1 .  predniSONE  (DELTASONE ) 5 mg tablet, , Disp: , Rfl:  .  prochlorperazine  (COMPAZINE ) 10 mg  tablet, Take 10 mg by mouth every 6 (six) hours as needed for nausea or vomiting., Disp: , Rfl:  .  relugolix  120 mg tab tablet, Take 3 tablets (360 mg total) by mouth on day 1, then take 1 tablet (120 mg total) daily thereafter. ICD 10 code: C61, Disp: , Rfl:  .  senna (SENOKOT) 8.6 mg tablet, Take 2 tablets (17.2 mg total) by mouth at bedtime., Disp: 180 tablet, Rfl: 0 .  tamsulosin (FLOMAX) 0.4 mg cap, Take 1 capsule (0.4 mg total) by mouth daily., Disp: 90 capsule, Rfl: 3  Current Facility-Administered Medications:  .  leuprolide acetate (6 month) (LUPRON) injection 45 mg, 45 mg, intramuscular, Once, Brandon Jama Nicholaus DOUGLAS,  MD     Medication Request/Refills: Pharmacy Information (if applicable)   []  Not Applicable       [x]  Pharmacy listed  Send Medication Request un:[id number] [place], [place] - [address] AT [facility] [address] & [facility] [address] - PHONE: [phone] - FAX: [phone]                                                 []  Pharmacy not listed (added to pharmacy list in Epic) Send Medication Request to:      Listed Pharmacies: [id number] [place], [place] - [address] AT [facility] OF [address] & [facility] [address] - PHONE: [phone] - FAX: [phone]

## 2024-01-19 ENCOUNTER — Other Ambulatory Visit (HOSPITAL_COMMUNITY): Payer: Self-pay

## 2024-01-23 NOTE — Progress Notes (Unsigned)
 [facility] OFFICE PROGRESS NOTE  Patient Care Team: Patient, No Pcp Per as PCP - General (General Practice) Vertell Pont, RN as Oncology Nurse Navigator  Brandon [name] is a 63 y.o.male with history of IDM2, hyperlipidemia, [facility].  Staging showed multifocal skeletal metastases.   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 PSA  107 Germline testing: Referral placed Somatic testing: to be obtain Treatment: 12/13/23 started docetaxel  triple therapy   Borderline leukocytosis with recent G-CSF.  No fevers or signs of infection.   Anemia stable.  No need for transfusion today.  Post cycle 2 of docetaxel .   Clinically feeling better for the first time this week. Assessment & Plan Malignant neoplasm of prostate (HCC) continue triplet therapy with docetaxel , Orgovyx  and darolutamide   Follow-up earlier if new concerns At risk for adverse drug event calcium  (1000-1200 mg daily from food and supplements) and vitamin D3 (1000 IU daily) Healthy lifestyle to prevent diabetes and CV disease Aggressive cardiovascular risk management Exercise as able when disease is controlled Limit alcohol consumption and avoid smoking   No orders of the defined types were placed in this encounter.    Brandon JAYSON Chihuahua, MD  INTERVAL HISTORY: Patient returns for follow-up.  Oncology History  Malignant neoplasm of prostate (HCC)  10/16/2023 Imaging   CT urogram 1.  Moderate to severe bilateral hydroureteronephrosis, favored due to a locally invasive prostate neoplasm as discussed below. Recommend prompt evaluation of renal dysfunction and consideration for percutaneous decompression. Additionally, recommend correlation with PSA and urologic evaluation.  2.  Markedly irregular prostate with enhancing soft tissue infiltrating the posterior bladder wall, seminal vesicles, and extends along the  mesorectal fascia. Nodal disease extends to the external iliac chain nodes, however many of the common iliac and para-aortic nodes appear suspicious.  3.  Delayed bilateral nephrograms with no contrast excretion on delayed phase in the left kidney, indicative of compromised renal function.  4.  Sclerotic osseous metastatic disease.    10/30/2023 Initial Diagnosis   Malignant neoplasm of prostate (HCC)   11/01/2023 Cancer Staging   Staging form: Prostate, AJCC 8th Edition - Clinical stage from 11/01/2023: Stage IVB (cT2c, cN0, pM1b, PSA: 107, Grade Group: 4) - Signed by Brandon Rise, PA-C on 12/04/2023 Histopathologic type: Adenocarcinoma, NOS Stage prefix: Initial diagnosis Prostate specific antigen (PSA) range: 20 or greater Gleason primary pattern: 4 Gleason secondary pattern: 4 Gleason score: 8 Histologic grading system: 5 grade system Number of biopsy cores examined: 10 Number of biopsy cores positive: 10 Location of positive needle core biopsies: Both sides   11/01/2023 Pathology Results   Biopsy from [facility]. A. PROSTATE, LEFT APEX, CORE BIOPSY:  Prostatic adenocarcinoma, Grade group 4 (Gleason score 4+4=8), involving approximately 10% of the prostatic tissue. Intraductal carcinoma present.               B. PROSTATE, LEFT MID, CORE BIOPSY:               Prostatic adenocarcinoma, Grade group 4 (Gleason score 4+4=8),  involving approximately 25% of the prostatic tissue; Cribriform glands present. Atypical intraductal proliferation.               C. PROSTATE, LEFT BASE, CORE BIOPSY:               Prostatic adenocarcinoma, Grade group 4 (Gleason score 4+4=8),  involving 2 of 2 cores and 60-70% of the prostatic  tissue; Cribriform glands present. Intraductal carcinoma present.              Perineural invasion identified.   D. PROSTATE, RIGHT APEX, CORE BIOPSY:               Prostatic adenocarcinoma, Grade group 3 (Gleason score 4+3=7), involving 2 of 2 cores and 15% of the  prostatic tissue.   E. PROSTATE, RIGHT MID, CORE BIOPSY:               Prostatic adenocarcinoma, Grade group 3 (Gleason grade 4+3=7),  involving 2 of 2 cores and 10-15% of the prostatic tissue.              Intraductal carcinoma present. Perineural invasion identified.   F. PROSTATE, RIGHT BASE, CORE BIOPSY:                Prostatic adenocarcinoma, Grade group 3 (Gleason score 4+3=7), involving 2 of 2 cores and 20-25% of the prostatic tissue.              Intraductal carcinoma present.              Perineural invasion identified.   11/15/2023 PET scan   PSMA  PET 1. Multifocal intense radiotracer avid skeletal metastasis within the axillary skeleton.  2. No evidence of nodal metastasis. Enlarged LEFT external iliac node does not have significant radiotracer activity  3. No evidence of visceral metastasis.  4. Mild nonfocal radiotracer activity in the prostate gland.  5. Bilateral pleural effusions.    12/13/2023 -  Chemotherapy   Patient is on Treatment Plan : PROSTATE Docetaxel  (75) + Prednisone  q21d        PHYSICAL EXAMINATION: ECOG PERFORMANCE STATUS: {CHL ONC ECOG PS:[phone]}  There were no vitals filed for this visit. There were no vitals filed for this visit.  GENERAL: alert, no distress and comfortable SKIN: skin color normal and no jaundice or bruising or petechiae on exposed skin EYES: normal, sclera clear OROPHARYNX: no exudate  NECK: No palpable mass LYMPH:  no palpable cervical, axillary lymphadenopathy  LUNGS: clear to auscultation and no wheeze or rales with normal breathing effort HEART: regular rate & rhythm  ABDOMEN: abdomen soft, non-tender and nondistended. Musculoskeletal: no edema NEURO: no focal motor/sensory deficits  Relevant data reviewed during this visit included labs.  New labs ordered.

## 2024-01-23 NOTE — Assessment & Plan Note (Addendum)
 continue triplet therapy with docetaxel , Orgovyx  and darolutamide   Follow-up earlier if new concerns

## 2024-01-23 NOTE — Assessment & Plan Note (Addendum)
 calcium  (1000-1200 mg daily from food and supplements) and vitamin D3 (1000 IU daily) Healthy lifestyle to prevent diabetes and CV disease Aggressive cardiovascular risk management Exercise as able when disease is controlled Limit alcohol consumption and avoid smoking

## 2024-01-24 ENCOUNTER — Inpatient Hospital Stay: Payer: Self-pay

## 2024-01-24 ENCOUNTER — Other Ambulatory Visit: Payer: Self-pay

## 2024-01-24 ENCOUNTER — Other Ambulatory Visit (HOSPITAL_COMMUNITY): Payer: Self-pay

## 2024-01-24 VITALS — BP 135/86 | HR 110 | Temp 97.5°F | Resp 16 | Ht 74.0 in | Wt 133.0 lb

## 2024-01-24 VITALS — BP 145/92 | HR 87 | Resp 16

## 2024-01-24 DIAGNOSIS — N401 Enlarged prostate with lower urinary tract symptoms: Secondary | ICD-10-CM

## 2024-01-24 DIAGNOSIS — C61 Malignant neoplasm of prostate: Secondary | ICD-10-CM

## 2024-01-24 DIAGNOSIS — Z9189 Other specified personal risk factors, not elsewhere classified: Secondary | ICD-10-CM

## 2024-01-24 DIAGNOSIS — N1339 Other hydronephrosis: Secondary | ICD-10-CM

## 2024-01-24 LAB — CMP (CANCER CENTER ONLY)
ALT: 9 U/L (ref 0–44)
AST: 11 U/L — ABNORMAL LOW (ref 15–41)
Albumin: 3.8 g/dL (ref 3.5–5.0)
Alkaline Phosphatase: 111 U/L (ref 38–126)
Anion gap: 11 (ref 5–15)
BUN: 86 mg/dL — ABNORMAL HIGH (ref 8–23)
CO2: 20 mmol/L — ABNORMAL LOW (ref 22–32)
Calcium: 9.2 mg/dL (ref 8.9–10.3)
Chloride: 99 mmol/L (ref 98–111)
Creatinine: 3.95 mg/dL — ABNORMAL HIGH (ref 0.61–1.24)
GFR, Estimated: 16 mL/min — ABNORMAL LOW (ref >60)
Glucose, Bld: 395 mg/dL — ABNORMAL HIGH (ref 70–99)
Potassium: 4.6 mmol/L (ref 3.5–5.1)
Sodium: 130 mmol/L — ABNORMAL LOW (ref 135–145)
Total Bilirubin: 0.3 mg/dL (ref 0.0–1.2)
Total Protein: 7.6 g/dL (ref 6.5–8.1)

## 2024-01-24 LAB — CBC WITH DIFFERENTIAL (CANCER CENTER ONLY)
Abs Immature Granulocytes: 0.07 K/uL (ref 0.00–0.07)
Basophils Absolute: 0 K/uL (ref 0.0–0.1)
Basophils Relative: 0 %
Eosinophils Absolute: 0 K/uL (ref 0.0–0.5)
Eosinophils Relative: 0 %
HCT: 31.7 % — ABNORMAL LOW (ref 39.0–52.0)
Hemoglobin: 10.2 g/dL — ABNORMAL LOW (ref 13.0–17.0)
Immature Granulocytes: 1 %
Lymphocytes Relative: 7 %
Lymphs Abs: 0.7 K/uL (ref 0.7–4.0)
MCH: 27.1 pg (ref 26.0–34.0)
MCHC: 32.2 g/dL (ref 30.0–36.0)
MCV: 84.3 fL (ref 80.0–100.0)
Monocytes Absolute: 0.2 K/uL (ref 0.1–1.0)
Monocytes Relative: 2 %
Neutro Abs: 9.6 K/uL — ABNORMAL HIGH (ref 1.7–7.7)
Neutrophils Relative %: 90 %
Platelet Count: 295 K/uL (ref 150–400)
RBC: 3.76 MIL/uL — ABNORMAL LOW (ref 4.22–5.81)
RDW: 17.6 % — ABNORMAL HIGH (ref 11.5–15.5)
WBC Count: 10.5 K/uL (ref 4.0–10.5)
nRBC: 0 % (ref 0.0–0.2)

## 2024-01-24 LAB — PSA: Prostatic Specific Antigen: 0.26 ng/mL (ref 0.00–4.00)

## 2024-01-24 LAB — SAMPLE TO BLOOD BANK

## 2024-01-24 MED ORDER — DEXAMETHASONE SOD PHOSPHATE PF 10 MG/ML IJ SOLN
10.0000 mg | Freq: Once | INTRAMUSCULAR | Status: AC
  Administered 2024-01-24: 10 mg via INTRAVENOUS

## 2024-01-24 MED ORDER — SODIUM CHLORIDE 0.9 % IV SOLN: INTRAVENOUS | Status: DC

## 2024-01-24 MED ORDER — SODIUM CHLORIDE 0.9 % IV SOLN
65.0000 mg/m2 | Freq: Once | INTRAVENOUS | Status: AC
  Administered 2024-01-24: 120 mg via INTRAVENOUS
  Filled 2024-01-24: qty 12

## 2024-01-24 MED ORDER — INSULIN GLARGINE 100 UNIT/ML SOLOSTAR PEN
20.0000 [IU] | PEN_INJECTOR | Freq: Every evening | SUBCUTANEOUS | 3 refills | Status: AC
  Filled 2024-01-25: qty 30, 150d supply, fill #0

## 2024-01-24 MED ORDER — INSULIN ASPART 100 UNIT/ML IJ SOLN
10.0000 [IU] | Freq: Once | INTRAMUSCULAR | Status: AC
  Administered 2024-01-24: 10 [IU] via SUBCUTANEOUS
  Filled 2024-01-24: qty 1

## 2024-01-24 NOTE — Progress Notes (Signed)
 Per Dr. Tina- patient to recheck his blood sugars at home. Patient aware.

## 2024-01-24 NOTE — Progress Notes (Signed)
 Order entered for Novolog  10 units for BG=395 per Dr. Tina.  Zacherie Honeyman, PharmD, MBA

## 2024-01-24 NOTE — Assessment & Plan Note (Signed)
 Repeat CT for evaluation Follow up with IR for exchange as he originally planned Referral to urology

## 2024-01-24 NOTE — Patient Instructions (Signed)
 [facility] - A DEPT OF [facility]  Discharge Instructions: Thank you for choosing [facility] to provide your oncology and hematology care.   If you have a lab appointment with the Cancer Center, please go directly to the Cancer Center and check in at the registration area.   Wear comfortable clothing and clothing appropriate for easy access to any Portacath or PICC line.   We strive to give you quality time with your provider. You may need to reschedule your appointment if you arrive late (15 or more minutes).  Arriving late affects you and other patients whose appointments are after yours.  Also, if you miss three or more appointments without notifying the office, you may be dismissed from the clinic at the provider's discretion.      For prescription refill requests, have your pharmacy contact our office and allow 72 hours for refills to be completed.    Today you received the following chemotherapy and/or immunotherapy agents Taxotere.      To help prevent nausea and vomiting after your treatment, we encourage you to take your nausea medication as directed.  BELOW ARE SYMPTOMS THAT SHOULD BE REPORTED IMMEDIATELY: *FEVER GREATER THAN 100.4 F (38 C) OR HIGHER *CHILLS OR SWEATING *NAUSEA AND VOMITING THAT IS NOT CONTROLLED WITH YOUR NAUSEA MEDICATION *UNUSUAL SHORTNESS OF BREATH *UNUSUAL BRUISING OR BLEEDING *URINARY PROBLEMS (pain or burning when urinating, or frequent urination) *BOWEL PROBLEMS (unusual diarrhea, constipation, pain near the anus) TENDERNESS IN MOUTH AND THROAT WITH OR WITHOUT PRESENCE OF ULCERS (sore throat, sores in mouth, or a toothache) UNUSUAL RASH, SWELLING OR PAIN  UNUSUAL VAGINAL DISCHARGE OR ITCHING   Items with * indicate a potential emergency and should be followed up as soon as possible or go to the Emergency Department if any problems should occur.  Please show the CHEMOTHERAPY ALERT CARD or IMMUNOTHERAPY  ALERT CARD at check-in to the Emergency Department and triage nurse.  Should you have questions after your visit or need to cancel or reschedule your appointment, please contact [facility] - A DEPT OF [facility]  Dept: [phone]  and follow the prompts.  Office hours are 8:00 a.m. to 4:30 p.m. Monday - Friday. Please note that voicemails left after 4:00 p.m. may not be returned until the following business day.  We are closed weekends and major holidays. You have access to a nurse at all times for urgent questions. Please call the main number to the clinic Dept: [phone] and follow the prompts.   For any non-urgent questions, you may also contact your provider using MyChart. We now offer e-Visits for anyone 69 and older to request care online for non-urgent symptoms. For details visit [email or web address].   Also download the MyChart app! Go to the app store, search "MyChart", open the app, select [facility], and log in with your MyChart username and password.

## 2024-01-25 ENCOUNTER — Other Ambulatory Visit (HOSPITAL_COMMUNITY): Payer: Self-pay

## 2024-01-25 ENCOUNTER — Ambulatory Visit: Payer: Self-pay

## 2024-01-25 LAB — TESTOSTERONE: Testosterone: <3 ng/dL — ABNORMAL LOW (ref 264–916)

## 2024-01-26 ENCOUNTER — Inpatient Hospital Stay: Payer: Self-pay

## 2024-01-26 VITALS — BP 136/87 | HR 118 | Temp 97.2°F | Resp 18

## 2024-01-26 DIAGNOSIS — C61 Malignant neoplasm of prostate: Secondary | ICD-10-CM

## 2024-01-26 MED ORDER — PEGFILGRASTIM-FPGK 6 MG/0.6ML ~~LOC~~ SOSY
6.0000 mg | PREFILLED_SYRINGE | Freq: Once | SUBCUTANEOUS | Status: AC
  Administered 2024-01-26: 6 mg via SUBCUTANEOUS
  Filled 2024-01-26: qty 0.6

## 2024-01-29 ENCOUNTER — Other Ambulatory Visit: Payer: Self-pay

## 2024-01-29 ENCOUNTER — Other Ambulatory Visit (HOSPITAL_COMMUNITY): Payer: Self-pay

## 2024-01-31 ENCOUNTER — Other Ambulatory Visit (HOSPITAL_COMMUNITY): Payer: Self-pay

## 2024-01-31 ENCOUNTER — Other Ambulatory Visit: Payer: Self-pay

## 2024-01-31 DIAGNOSIS — C61 Malignant neoplasm of prostate: Secondary | ICD-10-CM

## 2024-01-31 MED ORDER — PROCHLORPERAZINE MALEATE 10 MG PO TABS
10.0000 mg | ORAL_TABLET | Freq: Four times a day (QID) | ORAL | 1 refills | Status: AC | PRN
  Filled 2024-01-31: qty 30, 8d supply, fill #0

## 2024-01-31 MED ORDER — RELUGOLIX 120 MG PO TABS: ORAL_TABLET | ORAL | 0 refills | Status: AC

## 2024-02-01 ENCOUNTER — Other Ambulatory Visit (HOSPITAL_COMMUNITY): Payer: Self-pay

## 2024-02-04 ENCOUNTER — Other Ambulatory Visit (HOSPITAL_COMMUNITY): Payer: Self-pay

## 2024-02-05 ENCOUNTER — Ambulatory Visit (HOSPITAL_COMMUNITY)
Admission: RE | Admit: 2024-02-05 | Discharge: 2024-02-05 | Disposition: A | Discharge status: Home / Self Care | Payer: Self-pay | Source: Ambulatory Visit

## 2024-02-05 ENCOUNTER — Other Ambulatory Visit (HOSPITAL_COMMUNITY): Payer: Self-pay

## 2024-02-05 DIAGNOSIS — C61 Malignant neoplasm of prostate: Secondary | ICD-10-CM | POA: Insufficient documentation

## 2024-02-05 DIAGNOSIS — N1339 Other hydronephrosis: Secondary | ICD-10-CM | POA: Insufficient documentation

## 2024-02-05 MED ORDER — SENNOSIDES 8.6 MG PO TABS: 2.0000 | ORAL_TABLET | Freq: Every day | ORAL | 0 refills | Status: AC

## 2024-02-05 MED ORDER — DEXCOM G7 SENSOR MISC: 3 refills | Status: AC

## 2024-02-05 MED ORDER — INSULIN LISPRO (1 UNIT DIAL) 100 UNIT/ML (KWIKPEN)
PEN_INJECTOR | SUBCUTANEOUS | 3 refills | Status: AC
  Filled 2024-02-05: qty 15, 30d supply, fill #0
  Filled 2024-04-14: qty 15, 30d supply, fill #1

## 2024-02-06 ENCOUNTER — Other Ambulatory Visit (HOSPITAL_COMMUNITY): Payer: Self-pay

## 2024-02-06 MED ORDER — EMBRACE BLOOD GLUCOSE MONITOR DEVI: 0 refills | Status: AC

## 2024-02-06 MED ORDER — DEXCOM G7 RECEIVER DEVI: 3 refills | Status: AC

## 2024-02-06 MED ORDER — GLUCOSE BLOOD VI STRP: ORAL_STRIP | 0 refills | Status: AC

## 2024-02-06 MED ORDER — POLYETHYLENE GLYCOL 3350 17 GM/SCOOP PO POWD: ORAL | 0 refills | Status: DC

## 2024-02-07 ENCOUNTER — Other Ambulatory Visit: Payer: Self-pay

## 2024-02-07 ENCOUNTER — Other Ambulatory Visit (HOSPITAL_COMMUNITY): Payer: Self-pay

## 2024-02-07 DIAGNOSIS — C61 Malignant neoplasm of prostate: Secondary | ICD-10-CM

## 2024-02-08 ENCOUNTER — Other Ambulatory Visit (HOSPITAL_COMMUNITY): Payer: Self-pay

## 2024-02-08 ENCOUNTER — Other Ambulatory Visit: Payer: Self-pay

## 2024-02-08 ENCOUNTER — Ambulatory Visit: Payer: Self-pay

## 2024-02-08 MED ORDER — ONDANSETRON HCL 8 MG PO TABS
8.0000 mg | ORAL_TABLET | Freq: Three times a day (TID) | ORAL | 1 refills | Status: AC | PRN
  Filled 2024-02-08: qty 30, 10d supply, fill #0

## 2024-02-08 MED ORDER — DEXAMETHASONE 4 MG PO TABS
ORAL_TABLET | ORAL | 1 refills | Status: AC
  Filled 2024-02-08: qty 30, 8d supply, fill #0

## 2024-02-08 NOTE — Nursing Note (Signed)
 An After Visit SUMMARY  was printed and given to thepatient. Pt/family verbalize understanding. Instructed how/when to connect bags to neph tubes if needed and when to seek medical care. Follow up appt given.

## 2024-02-11 ENCOUNTER — Other Ambulatory Visit: Payer: Self-pay

## 2024-02-11 DIAGNOSIS — N1339 Other hydronephrosis: Secondary | ICD-10-CM

## 2024-02-11 NOTE — Progress Notes (Signed)
 RN successfully faxed referral to [facility].  Will follow to ensure he is scheduled.

## 2024-02-12 ENCOUNTER — Telehealth: Payer: Self-pay

## 2024-02-12 NOTE — Telephone Encounter (Signed)
 Spoke with the wife patient had a procedure on Friday at [facility]. His nephrostomy tubes are capped and a stent was placed in his kidney. He is now urinating without difficulty. Wants to know if the procedure affect his chemo on Thursday. Arland Legions BSN RN

## 2024-02-12 NOTE — Telephone Encounter (Signed)
 Returned spouses call after speaking with Dr Tina patient may come to chemo as long as he is able to urinate. WifeVU and patient will be here for his appointment. Arland Legions BSN RN

## 2024-02-13 NOTE — Progress Notes (Unsigned)
 [facility] OFFICE PROGRESS NOTE  Patient Care Team: Patient, No Pcp Per as PCP - General (General Practice) Vertell Pont, RN as Oncology Nurse Navigator  Yosef is a 63 y.o.male with history of IDDM2, hyperlipidemia, being [facility] for prostate cancer.  Staging showed multifocal skeletal metastases.   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 PSA  107 Germline testing: Referral placed Somatic testing: to be obtain Treatment: 12/13/23 started docetaxel  triple therapy  Patient unfortunately not feel well today.  After capping of nephrostomy tubes, he has not felt well.  We urgently called our IR team to help with uncapping the nephrostomy tube. Assessment & Plan   No orders of the defined types were placed in this encounter.    [name], MD  INTERVAL HISTORY: Patient returns for follow-up.  He is here with his wife today.  He is on a wheelchair.  Reports since his catheter exchange, and being capped he has not felt well.  Feels little bit for fullness in the lower pelvis.  He is urinating frequently, but denies good amounts.  He is drinking 64 ounces a day.  No fever but some chills.  He denies abdominal pain.  Overall he feels weaker.  Oncology History  Malignant neoplasm of prostate (HCC)  10/16/2023 Imaging   CT urogram 1.  Moderate to severe bilateral hydroureteronephrosis, favored due to a locally invasive prostate neoplasm as discussed below. Recommend prompt evaluation of renal dysfunction and consideration for percutaneous decompression. Additionally, recommend correlation with PSA and urologic evaluation.  2.  Markedly irregular prostate with enhancing soft tissue infiltrating the posterior bladder wall, seminal vesicles, and extends along the mesorectal fascia. Nodal disease extends to the external iliac chain nodes, however many of the common iliac and para-aortic nodes appear  suspicious.  3.  Delayed bilateral nephrograms with no contrast excretion on delayed phase in the left kidney, indicative of compromised renal function.  4.  Sclerotic osseous metastatic disease.    10/30/2023 Initial Diagnosis   Malignant neoplasm of prostate (HCC)   11/01/2023 Cancer Staging   Staging form: Prostate, AJCC 8th Edition - Clinical stage from 11/01/2023: Stage IVB (cT2c, cN0, pM1b, PSA: 107, Grade Group: 4) - Signed by Sherwood Rise, PA-C on 12/04/2023 Histopathologic type: Adenocarcinoma, NOS Stage prefix: Initial diagnosis Prostate specific antigen (PSA) range: 20 or greater Gleason primary pattern: 4 Gleason secondary pattern: 4 Gleason score: 8 Histologic grading system: 5 grade system Number of biopsy cores examined: 10 Number of biopsy cores positive: 10 Location of positive needle core biopsies: Both sides   11/01/2023 Pathology Results   Biopsy from [facility]. A. PROSTATE, LEFT APEX, CORE BIOPSY:  Prostatic adenocarcinoma, Grade group 4 (Gleason score 4+4=8), involving approximately 10% of the prostatic tissue. Intraductal carcinoma present.               B. PROSTATE, LEFT MID, CORE BIOPSY:               Prostatic adenocarcinoma, Grade group 4 (Gleason score 4+4=8),  involving approximately 25% of the prostatic tissue; Cribriform glands present. Atypical intraductal proliferation.               C. PROSTATE, LEFT BASE, CORE BIOPSY:               Prostatic adenocarcinoma, Grade group 4 (Gleason score 4+4=8),  involving 2 of 2 cores and 60-70% of the prostatic tissue; Cribriform  glands present. Intraductal carcinoma present.              Perineural invasion identified.   D. PROSTATE, RIGHT APEX, CORE BIOPSY:               Prostatic adenocarcinoma, Grade group 3 (Gleason score 4+3=7), involving 2 of 2 cores and 15% of the prostatic tissue.   E. PROSTATE, RIGHT MID, CORE BIOPSY:               Prostatic adenocarcinoma, Grade group 3 (Gleason grade 4+3=7),   involving 2 of 2 cores and 10-15% of the prostatic tissue.              Intraductal carcinoma present. Perineural invasion identified.   F. PROSTATE, RIGHT BASE, CORE BIOPSY:                Prostatic adenocarcinoma, Grade group 3 (Gleason score 4+3=7), involving 2 of 2 cores and 20-25% of the prostatic tissue.              Intraductal carcinoma present.              Perineural invasion identified.   11/15/2023 PET scan   PSMA  PET 1. Multifocal intense radiotracer avid skeletal metastasis within the axillary skeleton.  2. No evidence of nodal metastasis. Enlarged LEFT external iliac node does not have significant radiotracer activity  3. No evidence of visceral metastasis.  4. Mild nonfocal radiotracer activity in the prostate gland.  5. Bilateral pleural effusions.    12/13/2023 -  Chemotherapy   Patient is on Treatment Plan : PROSTATE Docetaxel  (75) + Prednisone  q21d        PHYSICAL EXAMINATION: ECOG PERFORMANCE STATUS: 2  Vitals:   02/14/24 1307  BP: 108/83  Pulse: (!) 128  Resp: 20  Temp: 97.7 F (36.5 C)  SpO2: 100%   Filed Weights    GENERAL: alert, no distress and on wheelchair LUNGS: clear to auscultation and no wheeze or rales with normal breathing effort HEART: Tachycardic ABDOMEN: abdomen soft, non-tender and nondistended. Nephrostomy tube in place Musculoskeletal: no edema   Relevant data reviewed during this visit included labs.  New labs ordered.

## 2024-02-14 ENCOUNTER — Ambulatory Visit (HOSPITAL_COMMUNITY)
Admission: RE | Admit: 2024-02-14 | Discharge: 2024-02-14 | Disposition: A | Discharge status: Home / Self Care | Payer: Self-pay | Source: Ambulatory Visit | Attending: Physician Assistant | Admitting: Physician Assistant

## 2024-02-14 ENCOUNTER — Encounter (HOSPITAL_COMMUNITY): Payer: Self-pay | Admitting: Radiology

## 2024-02-14 ENCOUNTER — Inpatient Hospital Stay: Payer: Self-pay

## 2024-02-14 ENCOUNTER — Other Ambulatory Visit (HOSPITAL_COMMUNITY): Payer: Self-pay | Admitting: Physician Assistant

## 2024-02-14 ENCOUNTER — Inpatient Hospital Stay (HOSPITAL_BASED_OUTPATIENT_CLINIC_OR_DEPARTMENT_OTHER): Payer: Self-pay

## 2024-02-14 VITALS — BP 108/83 | HR 128 | Temp 97.7°F | Resp 20

## 2024-02-14 DIAGNOSIS — C61 Malignant neoplasm of prostate: Secondary | ICD-10-CM | POA: Insufficient documentation

## 2024-02-14 DIAGNOSIS — D509 Iron deficiency anemia, unspecified: Secondary | ICD-10-CM | POA: Insufficient documentation

## 2024-02-14 DIAGNOSIS — N1330 Unspecified hydronephrosis: Secondary | ICD-10-CM | POA: Insufficient documentation

## 2024-02-14 DIAGNOSIS — D6182 Myelophthisis: Secondary | ICD-10-CM

## 2024-02-14 DIAGNOSIS — C7951 Secondary malignant neoplasm of bone: Secondary | ICD-10-CM | POA: Insufficient documentation

## 2024-02-14 DIAGNOSIS — N179 Acute kidney failure, unspecified: Secondary | ICD-10-CM

## 2024-02-14 DIAGNOSIS — R5383 Other fatigue: Secondary | ICD-10-CM

## 2024-02-14 DIAGNOSIS — Z79899 Other long term (current) drug therapy: Secondary | ICD-10-CM | POA: Insufficient documentation

## 2024-02-14 HISTORY — PX: IR PATIENT EVAL TECH 0-60 MINS: IMG5564

## 2024-02-14 LAB — CBC WITH DIFFERENTIAL (CANCER CENTER ONLY)
Abs Immature Granulocytes: 0.06 K/uL (ref 0.00–0.07)
Basophils Absolute: 0 K/uL (ref 0.0–0.1)
Basophils Relative: 0 %
Eosinophils Absolute: 0 K/uL (ref 0.0–0.5)
Eosinophils Relative: 0 %
HCT: 32.1 % — ABNORMAL LOW (ref 39.0–52.0)
Hemoglobin: 10.5 g/dL — ABNORMAL LOW (ref 13.0–17.0)
Immature Granulocytes: 0 %
Lymphocytes Relative: 4 %
Lymphs Abs: 0.6 K/uL — ABNORMAL LOW (ref 0.7–4.0)
MCH: 27.5 pg (ref 26.0–34.0)
MCHC: 32.7 g/dL (ref 30.0–36.0)
MCV: 84 fL (ref 80.0–100.0)
Monocytes Absolute: 0.6 K/uL (ref 0.1–1.0)
Monocytes Relative: 5 %
Neutro Abs: 12.6 K/uL — ABNORMAL HIGH (ref 1.7–7.7)
Neutrophils Relative %: 91 %
Platelet Count: 295 K/uL (ref 150–400)
RBC: 3.82 MIL/uL — ABNORMAL LOW (ref 4.22–5.81)
RDW: 17.9 % — ABNORMAL HIGH (ref 11.5–15.5)
WBC Count: 13.9 K/uL — ABNORMAL HIGH (ref 4.0–10.5)
nRBC: 0 % (ref 0.0–0.2)

## 2024-02-14 LAB — CMP (CANCER CENTER ONLY)
ALT: 10 U/L (ref 0–44)
AST: <10 U/L — ABNORMAL LOW (ref 15–41)
Albumin: 3.9 g/dL (ref 3.5–5.0)
Alkaline Phosphatase: 77 U/L (ref 38–126)
Anion gap: 14 (ref 5–15)
BUN: 107 mg/dL — ABNORMAL HIGH (ref 8–23)
CO2: 19 mmol/L — ABNORMAL LOW (ref 22–32)
Calcium: 9.7 mg/dL (ref 8.9–10.3)
Chloride: 97 mmol/L — ABNORMAL LOW (ref 98–111)
Creatinine: 4.31 mg/dL — ABNORMAL HIGH (ref 0.61–1.24)
GFR, Estimated: 15 mL/min — ABNORMAL LOW (ref >60)
Glucose, Bld: 325 mg/dL — ABNORMAL HIGH (ref 70–99)
Potassium: 4.8 mmol/L (ref 3.5–5.1)
Sodium: 130 mmol/L — ABNORMAL LOW (ref 135–145)
Total Bilirubin: 0.4 mg/dL (ref 0.0–1.2)
Total Protein: 7.8 g/dL (ref 6.5–8.1)

## 2024-02-14 LAB — PSA: Prostatic Specific Antigen: 0.06 ng/mL (ref 0.00–4.00)

## 2024-02-14 NOTE — Progress Notes (Signed)
 Patient presented to IR department with concerns of nephrostomy tube capping trial.  Cancer center nurses tried to attach bags to nephrostomy tube, but the attachments were different and bag would not lock to luer.  Nephrostomy Tubes were placed at [facility]. [name] PA also assessed the patient.  We determined attaching vinyl tubing to foley bag will lock to the nephstomy tube.  Patient instructed to contact primary health team with any other concerns.

## 2024-02-14 NOTE — Progress Notes (Signed)
 RN left message with medical records at [facility] to follow up with recent referral.

## 2024-02-14 NOTE — Procedures (Signed)
  Brandon [name], RT  Technologist Radiology   Progress Notes    Signed   Date of Service: 02/14/2024  1:30 PM   Signed      Patient presented to IR department with concerns of nephrostomy tube capping trial.  Cancer center nurses tried to attach bags to nephrostomy tube, but the attachments were different and bag would not lock to luer.  Nephrostomy Tubes were placed at [facility]. [name] PA also assessed the patient.  We determined attaching vinyl tubing to foley bag will lock to the nephstomy tube.  Patient instructed to contact primary health team with any other concerns.

## 2024-02-15 LAB — TESTOSTERONE: Testosterone: <3 ng/dL — ABNORMAL LOW (ref 264–916)

## 2024-02-16 ENCOUNTER — Emergency Department (HOSPITAL_BASED_OUTPATIENT_CLINIC_OR_DEPARTMENT_OTHER)
Admission: EM | Admit: 2024-02-16 | Discharge: 2024-02-16 | Disposition: A | Discharge status: Home / Self Care | Payer: Self-pay | Attending: Emergency Medicine | Admitting: Emergency Medicine

## 2024-02-16 ENCOUNTER — Other Ambulatory Visit: Payer: Self-pay

## 2024-02-16 ENCOUNTER — Inpatient Hospital Stay: Payer: Self-pay

## 2024-02-16 ENCOUNTER — Emergency Department (HOSPITAL_BASED_OUTPATIENT_CLINIC_OR_DEPARTMENT_OTHER): Payer: Self-pay

## 2024-02-16 ENCOUNTER — Encounter (HOSPITAL_BASED_OUTPATIENT_CLINIC_OR_DEPARTMENT_OTHER): Payer: Self-pay

## 2024-02-16 ENCOUNTER — Other Ambulatory Visit (HOSPITAL_COMMUNITY): Payer: Self-pay

## 2024-02-16 DIAGNOSIS — K5900 Constipation, unspecified: Secondary | ICD-10-CM

## 2024-02-16 DIAGNOSIS — N10 Acute tubulo-interstitial nephritis: Secondary | ICD-10-CM | POA: Insufficient documentation

## 2024-02-16 DIAGNOSIS — K5641 Fecal impaction: Secondary | ICD-10-CM | POA: Insufficient documentation

## 2024-02-16 DIAGNOSIS — Z794 Long term (current) use of insulin: Secondary | ICD-10-CM | POA: Insufficient documentation

## 2024-02-16 DIAGNOSIS — R000 Tachycardia, unspecified: Secondary | ICD-10-CM | POA: Insufficient documentation

## 2024-02-16 DIAGNOSIS — Z8546 Personal history of malignant neoplasm of prostate: Secondary | ICD-10-CM | POA: Insufficient documentation

## 2024-02-16 DIAGNOSIS — E119 Type 2 diabetes mellitus without complications: Secondary | ICD-10-CM | POA: Insufficient documentation

## 2024-02-16 LAB — URINALYSIS, W/ REFLEX TO CULTURE (INFECTION SUSPECTED)
Bilirubin Urine: NEGATIVE
Bilirubin Urine: NEGATIVE
Glucose, UA: 500 mg/dL — AB
Glucose, UA: 500 mg/dL — AB
Ketones, ur: NEGATIVE mg/dL
Ketones, ur: NEGATIVE mg/dL
Nitrite: NEGATIVE
Nitrite: NEGATIVE
Protein, ur: 30 mg/dL — AB
Protein, ur: 30 mg/dL — AB
Specific Gravity, Urine: 1.015 (ref 1.005–1.030)
Specific Gravity, Urine: 1.015 (ref 1.005–1.030)
WBC, UA: >50 WBC/hpf (ref 0–5)
WBC, UA: >50 WBC/hpf (ref 0–5)
pH: 5.5 (ref 5.0–8.0)
pH: 5.5 (ref 5.0–8.0)

## 2024-02-16 LAB — LIPASE, BLOOD: Lipase: 44 U/L (ref 11–51)

## 2024-02-16 LAB — COMPREHENSIVE METABOLIC PANEL WITH GFR
ALT: 15 U/L (ref 0–44)
AST: 12 U/L — ABNORMAL LOW (ref 15–41)
Albumin: 3.7 g/dL (ref 3.5–5.0)
Alkaline Phosphatase: 83 U/L (ref 38–126)
Anion gap: 17 — ABNORMAL HIGH (ref 5–15)
BUN: 107 mg/dL — ABNORMAL HIGH (ref 8–23)
CO2: 19 mmol/L — ABNORMAL LOW (ref 22–32)
Calcium: 9.9 mg/dL (ref 8.9–10.3)
Chloride: 93 mmol/L — ABNORMAL LOW (ref 98–111)
Creatinine, Ser: 3.89 mg/dL — ABNORMAL HIGH (ref 0.61–1.24)
GFR, Estimated: 17 mL/min — ABNORMAL LOW (ref >60)
Glucose, Bld: 302 mg/dL — ABNORMAL HIGH (ref 70–99)
Potassium: 4.3 mmol/L (ref 3.5–5.1)
Sodium: 129 mmol/L — ABNORMAL LOW (ref 135–145)
Total Bilirubin: 0.3 mg/dL (ref 0.0–1.2)
Total Protein: 7.8 g/dL (ref 6.5–8.1)

## 2024-02-16 LAB — CBC WITH DIFFERENTIAL/PLATELET
Abs Immature Granulocytes: 0.02 K/uL (ref 0.00–0.07)
Basophils Absolute: 0 K/uL (ref 0.0–0.1)
Basophils Relative: 0 %
Eosinophils Absolute: 0 K/uL (ref 0.0–0.5)
Eosinophils Relative: 0 %
HCT: 32.9 % — ABNORMAL LOW (ref 39.0–52.0)
Hemoglobin: 10.5 g/dL — ABNORMAL LOW (ref 13.0–17.0)
Immature Granulocytes: 0 %
Lymphocytes Relative: 7 %
Lymphs Abs: 0.5 K/uL — ABNORMAL LOW (ref 0.7–4.0)
MCH: 27.3 pg (ref 26.0–34.0)
MCHC: 31.9 g/dL (ref 30.0–36.0)
MCV: 85.7 fL (ref 80.0–100.0)
Monocytes Absolute: 0.5 K/uL (ref 0.1–1.0)
Monocytes Relative: 6 %
Neutro Abs: 6.8 K/uL (ref 1.7–7.7)
Neutrophils Relative %: 87 %
Platelets: 288 K/uL (ref 150–400)
RBC: 3.84 MIL/uL — ABNORMAL LOW (ref 4.22–5.81)
RDW: 17.8 % — ABNORMAL HIGH (ref 11.5–15.5)
WBC: 7.9 K/uL (ref 4.0–10.5)
nRBC: 0 % (ref 0.0–0.2)

## 2024-02-16 LAB — LACTIC ACID, PLASMA: Lactic Acid, Venous: 1.6 mmol/L (ref 0.5–1.9)

## 2024-02-16 MED ORDER — FLEET ENEMA RE ENEM
1.0000 | ENEMA | Freq: Once | RECTAL | Status: AC
  Administered 2024-02-16: 1 via RECTAL
  Filled 2024-02-16: qty 1

## 2024-02-16 MED ORDER — POLYETHYLENE GLYCOL 3350 17 GM/SCOOP PO POWD
17.0000 g | Freq: Every day | ORAL | 0 refills | Status: AC | PRN
  Filled 2024-02-16: qty 238, 14d supply, fill #0

## 2024-02-16 MED ORDER — SODIUM CHLORIDE 0.9 % IV BOLUS
1000.0000 mL | Freq: Once | INTRAVENOUS | Status: AC
  Administered 2024-02-16: 1000 mL via INTRAVENOUS

## 2024-02-16 MED ORDER — SODIUM CHLORIDE 0.9 % IV SOLN
2.0000 g | Freq: Once | INTRAVENOUS | Status: AC
  Administered 2024-02-16: 2 g via INTRAVENOUS
  Filled 2024-02-16: qty 20

## 2024-02-16 MED ORDER — POLYETHYLENE GLYCOL 3350 17 G PO PACK
17.0000 g | PACK | Freq: Every day | ORAL | Status: DC
  Administered 2024-02-16: 17 g via ORAL
  Filled 2024-02-16: qty 1

## 2024-02-16 MED ORDER — CEPHALEXIN 500 MG PO CAPS
500.0000 mg | ORAL_CAPSULE | Freq: Three times a day (TID) | ORAL | 0 refills | Status: AC
  Filled 2024-02-16: qty 21, 7d supply, fill #0

## 2024-02-16 NOTE — ED Triage Notes (Addendum)
 Arrives POV to the ED with complaints of worsening generalized body pain (more pain in his abdomen) and weakness for several days. Patient also reports that he currently has prostate cancer and he was sent here to rule out dehydration. No vomiting/diarrhea. Arrives with nephrostomy tube in place.

## 2024-02-16 NOTE — ED Notes (Signed)
 Pt alert and oriented X 4 at the time of discharge. RR even and unlabored. No acute distress noted. Pt verbalized understanding of discharge instructions as discussed. Pt ambulatory to lobby at time of discharge.

## 2024-02-16 NOTE — ED Provider Notes (Signed)
 [place] EMERGENCY DEPARTMENT AT [facility] Provider Note   CSN: [id number] Arrival date & time: 02/16/24  1004     Patient presents with: Weakness   Brandon [name] is a 63 y.o. male.   Pt is a 63 yo male with pmhx significant for prostate cancer with mets to bone, hld, and DM.  Pt is here with increased body pain and weakness.  He has bilateral nephrostomy tubes because of hydronephrosis caused by prostate cancer mass.  He had the tubes changed on 11/7 at [facility].  He has been weak since then and has had a poor appetite.  He had a nephrostomy capping trial, but his family said he was urinating too much during chemo, so wanted the catheters hooked back up.  He denies any fevers.  He has lower abd pain.  He did not do chemo on Thursday (11/13) due to the weakness.       Prior to Admission medications   Medication Sig Start Date End Date Taking? Authorizing Provider  cephALEXin (KEFLEX) 500 MG capsule Take 1 capsule (500 mg total) by mouth 3 (three) times daily. 02/16/24  Yes Dean Clarity, MD  polyethylene glycol powder (MIRALAX) 17 GM/SCOOP powder Take 17 g by mouth daily as needed for moderate constipation. Dissolve 1 capful (17g) in 4-8 ounces of liquid and take by mouth daily. 02/16/24  Yes Dean Clarity, MD  atorvastatin  (LIPITOR) 20 MG tablet Take 20 mg by mouth at bedtime. 11/27/23 11/26/24  [provider]  Blood Glucose Monitoring Suppl (ACCU-CHEK GUIDE) w/Device KIT 4 (four) times daily. 09/03/23   [provider]  Blood Glucose Monitoring Suppl (EMBRACE BLOOD GLUCOSE MONITOR) DEVI Check blood sugar before meals and at bedtime. 11/22/23     Continuous Glucose Receiver (DEXCOM G7 RECEIVER) DEVI Use to check blood sugars at meals as directed. 01/25/24     Continuous Glucose Sensor (DEXCOM G7 SENSOR) MISC Use as directed to check blood sugars as needed as directed. 01/25/24   Murleen Fine, MD  darolutamide  (NUBEQA ) 300 MG tablet Take 1 tablet  (300 mg total) by mouth 2 (two) times daily with a meal. 12/25/23   Tina Pauletta BROCKS, MD  dexamethasone  (DECADRON ) 4 MG tablet Take 2 tabs by mouth 2 times daily starting day before chemo. Then take 2 tabs daily for 2 days starting day after chemo. Take with food. 02/08/24   Tina Pauletta BROCKS, MD  FORA Lancets MISC Lancets, ACHS (before meals and at bedtime), ICD-10 Code: Type 2 - E11.9 11/22/23   [provider]  gabapentin (NEURONTIN) 100 MG capsule Take 100 mg by mouth 2 (two) times daily. 11/24/23   [provider]  glucose blood test strip Check blood sugar before meals and at bedtime. 11/22/23     insulin  glargine (LANTUS) 100 UNIT/ML Solostar Pen Inject 20 Units into the skin every evening. 01/21/24   Murleen Fine, MD  Insulin  Isophane & Regular Human (NOVOLIN  70/30 FLEXPEN RELION) (70-30) 100 UNIT/ML PEN Inject 20 Units into the skin 2 (two) times daily with a meal. 01/14/19   Shamleffer, Donell Cardinal, MD  insulin  lispro (HUMALOG) 100 UNIT/ML KwikPen Inject into the skin. 11/22/23 11/21/24  [provider]  insulin  lispro (HUMALOG) 100 UNIT/ML KwikPen Inject into the skin as directed 4 times daily with meals and at bedtime per sliding scale 01/30/24   Murleen Fine, MD  Insulin  Pen Needle (RELION PEN NEEDLES) 32G X 4 MM MISC 1 Device by Does not apply route 2 (two) times daily.  01/14/19   Shamleffer, Ibtehal Jaralla, MD  leuprolide, 6 Month, (LUPRON) 45 MG injection Inject 45 mg into the muscle every 6 (six) months. 11/07/23   [provider]  Multiple Vitamins-Minerals (MULTIVITAMIN WITH MINERALS) tablet Take 1 tablet by mouth daily.    [provider]  ondansetron  (ZOFRAN ) 8 MG tablet Take 1 tablet (8 mg total) by mouth every 8 (eight) hours as needed for nausea or vomiting. 02/08/24   Tina Pauletta BROCKS, MD  polyethylene glycol powder (MIRALAX) 17 GM/SCOOP powder Dissolve 1 capful (17g) in 4-8 ounces of liquid and take by mouth daily. 11/22/23      PRECISION QID TEST test strip Test Strips - Appropriate for meter, ACHS (before meals and at bedtime), ICD-10 Code: Type 2 - E11.9 11/22/23   [provider]  predniSONE  (DELTASONE ) 5 MG tablet Take 1 tablet (5 mg total) by mouth in the morning and at bedtime. 12/06/23   Tina Pauletta BROCKS, MD  prochlorperazine  (COMPAZINE ) 10 MG tablet Take 1 tablet (10 mg total) by mouth every 6 (six) hours as needed for nausea or vomiting. 01/31/24   Tina Pauletta BROCKS, MD  relugolix  (ORGOVYX ) 120 MG tablet Take 3 tablets (360 mg total) by mouth on day 1, then take 1 tablet (120 mg total) daily thereafter. ICD 10 code: C61 01/31/24   Tina Pauletta BROCKS, MD  senna (SENOKOT) 8.6 MG tablet Take 2 tablets by mouth at bedtime. 11/22/23 02/20/24  [provider]  senna (SENOKOT) 8.6 MG tablet Take 2 tablets (17.2 mg total) by mouth at bedtime. 11/22/23     tamsulosin (FLOMAX) 0.4 MG CAPS capsule Take 0.4 mg by mouth daily.    [provider]    Allergies: Patient has no known allergies.    Review of Systems  Gastrointestinal:  Positive for abdominal pain.  All other systems reviewed and are negative.   Updated Vital Signs BP 138/89   Pulse 84   Temp 98.1 F (36.7 C) (Oral)   Resp 10   Ht 6' 2 (1.88 m)   Wt 60.3 kg   SpO2 100%   BMI 17.07 kg/m   Physical Exam Vitals and nursing note reviewed.  Constitutional:      Appearance: He is cachectic.  HENT:     Head: Normocephalic and atraumatic.     Right Ear: External ear normal.     Left Ear: External ear normal.     Nose: Nose normal.     Mouth/Throat:     Mouth: Mucous membranes are dry.  Eyes:     Extraocular Movements: Extraocular movements intact.     Conjunctiva/sclera: Conjunctivae normal.     Pupils: Pupils are equal, round, and reactive to light.  Cardiovascular:     Rate and Rhythm: Regular rhythm. Tachycardia present.     Pulses: Normal pulses.     Heart sounds: Normal heart sounds.  Pulmonary:     Effort: Pulmonary  effort is normal.     Breath sounds: Normal breath sounds.  Abdominal:     General: Abdomen is flat. Bowel sounds are normal.     Palpations: Abdomen is soft.     Tenderness: There is abdominal tenderness in the suprapubic area.  Genitourinary:    Comments: Bilateral nephrostomy tubes noted.  Urine cloudy in both. Musculoskeletal:        General: Normal range of motion.     Cervical back: Normal range of motion and neck supple.  Skin:    General: Skin is warm.  Capillary Refill: Capillary refill takes less than 2 seconds.  Neurological:     General: No focal deficit present.     Mental Status: He is alert and oriented to person, place, and time.  Psychiatric:        Mood and Affect: Mood normal.        Behavior: Behavior normal.     (all labs ordered are listed, but only abnormal results are displayed) Labs Reviewed  CBC WITH DIFFERENTIAL/PLATELET - Abnormal; Notable for the following components:      Result Value   RBC 3.84 (*)    Hemoglobin 10.5 (*)    HCT 32.9 (*)    RDW 17.8 (*)    Lymphs Abs 0.5 (*)    All other components within normal limits  COMPREHENSIVE METABOLIC PANEL WITH GFR - Abnormal; Notable for the following components:   Sodium 129 (*)    Chloride 93 (*)    CO2 19 (*)    Glucose, Bld 302 (*)    BUN 107 (*)    Creatinine, Ser 3.89 (*)    AST 12 (*)    GFR, Estimated 17 (*)    Anion gap 17 (*)    All other components within normal limits  URINALYSIS, W/ REFLEX TO CULTURE (INFECTION SUSPECTED) - Abnormal; Notable for the following components:   APPearance HAZY (*)    Glucose, UA 500 (*)    Hgb urine dipstick SMALL (*)    Protein, ur 30 (*)    Leukocytes,Ua LARGE (*)    Bacteria, UA MANY (*)    All other components within normal limits  URINALYSIS, W/ REFLEX TO CULTURE (INFECTION SUSPECTED) - Abnormal; Notable for the following components:   APPearance HAZY (*)    Glucose, UA 500 (*)    Hgb urine dipstick MODERATE (*)    Protein, ur 30 (*)     Leukocytes,Ua LARGE (*)    Bacteria, UA MANY (*)    Non Squamous Epithelial 0-5 (*)    All other components within normal limits  URINE CULTURE  CULTURE, BLOOD (ROUTINE X 2)  CULTURE, BLOOD (ROUTINE X 2)  URINE CULTURE  LIPASE, BLOOD  LACTIC ACID, PLASMA    EKG: None  Radiology: CT ABDOMEN PELVIS WO CONTRAST Result Date: 02/16/2024 CLINICAL DATA:  Abdominal pain.  History of prostate cancer. EXAM: CT ABDOMEN AND PELVIS WITHOUT CONTRAST TECHNIQUE: Multidetector CT imaging of the abdomen and pelvis was performed following the standard protocol without IV contrast. RADIATION DOSE REDUCTION: This exam was performed according to the departmental dose-optimization program which includes automated exposure control, adjustment of the mA and/or kV according to patient size and/or use of iterative reconstruction technique. COMPARISON:  02/05/2024 FINDINGS: Lower chest: Heart is normal size.  Visualized lung bases are clear. Hepatobiliary: Liver, gallbladder and biliary tree are normal. Pancreas: Normal. Spleen: Normal. Adrenals/Urinary Tract: Adrenal glands are normal. Kidneys are normal in size. Bilateral percutaneous nephrostomy tubes are present. Right-sided nephrostomy tube has pigtail over the right renal pelvis as the left-sided nephrostomy tube has pigtail over the region of the UPJ. Nephrostomy tubes extend through the ureters to the bladder as distal pigtails are over the bladder bilaterally. Air within the intrarenal collecting systems bilaterally unchanged. No significant perinephric inflammation or fluid. Stomach/Bowel: Stomach and small bowel are normal. Appendix is normal. Moderate fecal retention over the colon most prominent over the rectum with possible impaction. Vascular/Lymphatic: Minimal calcified plaque over the abdominal aorta which is normal caliber. Remaining vascular structures are unremarkable. No  evidence of adenopathy. Reproductive: Region of the prostate unchanged. Other: No  free fluid or focal inflammatory change. No free peritoneal air. Musculoskeletal: Known diffuse osseous metastatic disease unchanged. No acute findings. IMPRESSION: 1. No acute findings in the abdomen/pelvis. 2. Bilateral percutaneous nephrostomy tubes in place as described. No significant perinephric inflammation or fluid. No hydronephrosis. 3. Moderate fecal retention over the colon most prominent over the rectum with possible impaction. 4. Known diffuse osseous metastatic disease from patient's known prostate cancer unchanged. 5. Aortic atherosclerosis. Aortic Atherosclerosis (ICD10-I70.0). Electronically Signed   By: Toribio Agreste M.D.   On: 02/16/2024 11:53     .Fecal disimpaction  Date/Time: 02/16/2024 12:59 PM  Performed by: Dean Clarity, MD Authorized by: Dean Clarity, MD  Consent: Verbal consent obtained Risks and benefits: risks, benefits and alternatives were discussed Consent given by: patient Patient identity confirmed: verbally with patient Time out: Immediately prior to procedure a time out was called to verify the correct patient, procedure, equipment, support staff and site/side marked as required. Local anesthesia used: no  Anesthesia: Local anesthesia used: no  Sedation: Patient sedated: no  Patient tolerance: patient tolerated the procedure well with no immediate complications      Medications Ordered in the ED  polyethylene glycol (MIRALAX / GLYCOLAX) packet 17 g (17 g Oral Given 02/16/24 1349)  sodium chloride  0.9 % bolus 1,000 mL (0 mLs Intravenous Stopped 02/16/24 1301)  cefTRIAXone (ROCEPHIN) 2 g in sodium chloride  0.9 % 100 mL IVPB (0 g Intravenous Stopped 02/16/24 1301)  sodium phosphate  (FLEET) enema 1 enema (1 enema Rectal Given 02/16/24 1251)  sodium phosphate  (FLEET) enema 1 enema (1 enema Rectal Given 02/16/24 1349)                                    Medical Decision Making Amount and/or Complexity of Data Reviewed Labs:  ordered. Radiology: ordered.  Risk OTC drugs. Prescription drug management.   This patient presents to the ED for concern of weakness, this involves an extensive number of treatment options, and is a complaint that carries with it a high risk of complications and morbidity.  The differential diagnosis includes infection, dehydration, electrolyte abn, aki   Co morbidities that complicate the patient evaluation  prostate cancer with mets to bone, hld, and DM   Additional history obtained:  Additional history obtained from epic chart review External records from outside source obtained and reviewed including family   Lab Tests:  I Ordered, and personally interpreted labs.  The pertinent results include:  cbc with hgb 10.5 (stable); left and right nephrostomy UA + UTI; cmp with glucose elevated at 302, bun elevated at 107, cr elevated at 3.89 (bun 107 and cr 4.31 on 11/13)   Imaging Studies ordered:  I ordered imaging studies including ct abd pelvis  I independently visualized and interpreted imaging which showed  No acute findings in the abdomen/pelvis.  2. Bilateral percutaneous nephrostomy tubes in place as described.  No significant perinephric inflammation or fluid. No hydronephrosis.  3. Moderate fecal retention over the colon most prominent over the  rectum with possible impaction.  4. Known diffuse osseous metastatic disease from patient's known  prostate cancer unchanged.  5. Aortic atherosclerosis.    Aortic Atherosclerosis (ICD10-I70.0).   I agree with the radiologist interpretation   Medicines ordered and prescription drug management:  I ordered medication including ivfs/rocephin/enema  for sx  Reevaluation of the patient after  these medicines showed that the patient improved I have reviewed the patients home medicines and have made adjustments as needed   Test Considered:  ct   Critical Interventions:  enema   Problem List / ED  Course:  Bilateral pyelo:  no fevers.  Pt given 2 g rocephin.  Pt offered admission, but declines.  He knows to return if worse.  Urinary frequency likely due to infection.  Pt will try to cap his f/c again once infection has improved. Fecal impaction/constipation:  pt disimpacted and was given a fleet enema and had a large bowel movement and feels better.  Pt encouraged to try to increase fluid and fiber intake.     Reevaluation:  After the interventions noted above, I reevaluated the patient and found that they have :improved   Social Determinants of Health:  Lives at home   Dispostion:  After consideration of the diagnostic results and the patients response to treatment, I feel that the patent would benefit from discharge with outpatient f/u.       Final diagnoses:  Fecal impaction (HCC)  Constipation, unspecified constipation type  Acute pyelonephritis    ED Discharge Orders          Ordered    cephALEXin (KEFLEX) 500 MG capsule  3 times daily        02/16/24 1430    polyethylene glycol powder (MIRALAX) 17 GM/SCOOP powder  Daily PRN       Note to Pharmacy: packets not stocked. Changed to alternative bottle   02/16/24 1430               Dean Clarity, MD 02/16/24 1443

## 2024-02-17 ENCOUNTER — Other Ambulatory Visit: Payer: Self-pay

## 2024-02-18 ENCOUNTER — Other Ambulatory Visit: Payer: Self-pay

## 2024-02-18 ENCOUNTER — Ambulatory Visit: Payer: Self-pay

## 2024-02-18 DIAGNOSIS — G893 Neoplasm related pain (acute) (chronic): Secondary | ICD-10-CM

## 2024-02-18 DIAGNOSIS — C61 Malignant neoplasm of prostate: Secondary | ICD-10-CM

## 2024-02-18 MED ORDER — HYDROCODONE-ACETAMINOPHEN 5-325 MG PO TABS: 1.0000 | ORAL_TABLET | Freq: Four times a day (QID) | ORAL | 0 refills | Status: AC | PRN

## 2024-02-18 NOTE — Telephone Encounter (Signed)
 Notified of message below. States he is having body aches and pain all over, gets SOB with ambulation. Has been taking tylenol for pain, was asking how many he can take at a time. Is taking antibiotics for UTI.   Dr Tina is prescribing a pain pill for him to take, stop tylenol. Referral to Palliative Care at [facility], to see Dr Tina in 1 week. Needs to push fluids due to hx of constipation and now with added pain meds, will need to stay well hydrated.

## 2024-02-18 NOTE — Telephone Encounter (Signed)
-----   Message from [name] sent at 02/18/2024  2:23 PM EST ----- Benson Porcaro would you check on him or his wife to see how he is doing after the catheter was uncapped.  Making sure he is making good urine able to eat and drink more. Good news his PSA down to 0.06.    We should reschedule lab, visit and infusion in 2 to 3-week timeframe. Thank you ----- Message ----- From: Rebecka, Lab In [place] Sent: 02/14/2024  12:31 PM EST To: [name], MD

## 2024-02-18 NOTE — Progress Notes (Signed)
 Norco sent to pharmacy for cancer pain. Advised to increased fluid, stool softener, MiraLAX and fiber to decrease constipation.  Avoid taking extra Tylenol with Norco.

## 2024-02-19 LAB — URINE CULTURE
Culture: >=100000 — AB
Culture: >=100000 — AB

## 2024-02-19 NOTE — Progress Notes (Signed)
 RN reached out to new patient referral specialist at [facility] to follow up with recent referral.  Awaiting call back to ensure patient is reviewed for consult visit.

## 2024-02-20 ENCOUNTER — Telehealth (HOSPITAL_COMMUNITY): Payer: Self-pay | Admitting: Emergency Medicine

## 2024-02-20 ENCOUNTER — Telehealth (HOSPITAL_BASED_OUTPATIENT_CLINIC_OR_DEPARTMENT_OTHER): Payer: Self-pay | Admitting: Emergency Medicine

## 2024-02-20 ENCOUNTER — Other Ambulatory Visit (HOSPITAL_COMMUNITY): Payer: Self-pay

## 2024-02-20 DIAGNOSIS — R5383 Other fatigue: Secondary | ICD-10-CM | POA: Insufficient documentation

## 2024-02-20 MED ORDER — CIPROFLOXACIN HCL 500 MG PO TABS
500.0000 mg | ORAL_TABLET | Freq: Every day | ORAL | 0 refills | Status: AC
  Filled 2024-02-20: qty 5, 5d supply, fill #0

## 2024-02-20 MED ORDER — AMOXICILLIN 500 MG PO CAPS
500.0000 mg | ORAL_CAPSULE | Freq: Two times a day (BID) | ORAL | 0 refills | Status: AC
  Filled 2024-02-20: qty 14, 7d supply, fill #0

## 2024-02-20 NOTE — Assessment & Plan Note (Signed)
 Will hold docetaxel .

## 2024-02-20 NOTE — Telephone Encounter (Signed)
 Post ED Visit - Positive Culture Follow-up: Successful Patient Follow-Up  Culture assessed and recommendations reviewed by:  []  Rankin Dee, Pharm.D. []  Venetia Gully, Pharm.D., BCPS AQ-ID []  Garrel Crews, Pharm.D., BCPS []  Almarie Lunger, Pharm.D., BCPS []  [place], [place].D., BCPS, AAHIVP []  Rosaline Bihari, Pharm.D., BCPS, AAHIVP [x]  Vernell Meier, PharmD, BCPS []  Latanya Hint, PharmD, BCPS []  Donald Medley, PharmD, BCPS []  Rocky Bold, PharmD  Positive urine culture  []  Patient discharged without antimicrobial prescription and treatment is now indicated []  Organism is resistant to prescribed ED discharge antimicrobial []  Patient with positive blood cultures  Changes discussed with ED provider: Jerilynn Later, MD New antibiotic prescription Cipro 500 mg q 24 hr x five days            Amoxicillin 500 mg BID x five days Called to [facility] patient, date 02/20/2024, time 0900. Pt/family contacted by Dr Later. RX called to [facility] by Dr Later.   Brandon [name] Harjot Dibello 02/20/2024, 11:05 AM

## 2024-02-20 NOTE — Assessment & Plan Note (Addendum)
 Post C3 of docetaxel , will hold this week. Continue Orgovyx  and darolutamide   Follow-up 1-2 weeks.

## 2024-02-20 NOTE — Assessment & Plan Note (Addendum)
 Bilateral nephrostomy tube in place.  Recently capped and worsening renal function.  Urgently sent to IR for help  Continue 64 ounce of fluid daily Has follow-up with IR at [facility] for any new nephrostomy tube issues

## 2024-02-20 NOTE — Assessment & Plan Note (Signed)
 Overall improved. Profound bone mets from diagnosis

## 2024-02-20 NOTE — Telephone Encounter (Signed)
 Patient was recently seen for back pain, diagnosed with pyelonephritis, discharged on course of Keflex.  Patient's culture report came back with multiple bacteria, not sensitive to Keflex.  Reviewed with pharmacist for culture report.  Will change patient to 5 days of daily Cipro as well as amoxicillin twice daily for 7 days.  Called patient, patient reports that he is doing well, discussed this with him, discussed change in antibiotics and he expressed understanding.  He also noted that he was unsure which pharmacy and needed to be sent to and request that I call his wife.  I separately called the patient's wife, Pearlene who confirmed that she would like the prescription sent to the [facility].  Prescriptions sent to preferred pharmacy.

## 2024-02-20 NOTE — Progress Notes (Signed)
 ED Antimicrobial Stewardship Positive Culture Follow Up   Brandon [name] is an 63 y.o. male who presented to [facility] on 02/16/2024 with a chief complaint of  Chief Complaint  Patient presents with   Weakness    Recent Results (from the past 720 hours)  Urine Culture     Status: Abnormal   Collection Time: 02/16/24 10:34 AM   Specimen: Urine, Random  Result Value Ref Range Status   Specimen Description   Final    URINE, RANDOM Performed at [facility], [address] [place], [place] [address]    Special Requests   Final    NONE Reflexed from [id number] Performed at [facility], [address], [place], [place] [address]    Culture (A)  Final    >=100,000 COLONIES/mL CITROBACTER SPECIES >=100,000 COLONIES/mL ENTEROBACTER SPECIES >=100,000 COLONIES/mL ENTEROCOCCUS SPECIES SUSCEPTIBILITIES PERFORMED ON PREVIOUS CULTURE WITHIN THE LAST 5 DAYS. Performed at [facility], [address], [place], [place] [address]    Report Status 02/19/2024 FINAL  Final  Culture, blood (routine x 2)     Status: None (Preliminary result)   Collection Time: 02/16/24 11:15 AM   Specimen: BLOOD  Result Value Ref Range Status   Specimen Description   Final    BLOOD RIGHT ANTECUBITAL Performed at [facility], [address], [place], [place] [address]    Special Requests   Final    Blood Culture adequate volume Performed at [facility], [address], [place], [place] [address]    Culture   Final    NO GROWTH 4 DAYS Performed at [facility], [address] [place], [place] [address]    Report Status PENDING  Incomplete  Urine Culture     Status: Abnormal   Collection Time: 02/16/24 11:20 AM   Specimen: Urine, Catheterized  Result Value Ref Range Status   Specimen Description   Final    URINE, CATHETERIZED Performed at [facility], [address], [place], [place] [address]     Special Requests   Final    NONE Performed at [facility], [address] [place], [place] [address]    Culture (A)  Final    >=100,000 COLONIES/mL CITROBACTER FREUNDII 40,000 COLONIES/mL ENTEROBACTER CLOACAE 20,000 COLONIES/mL ENTEROCOCCUS FAECALIS    Report Status 02/19/2024 FINAL  Final   Organism ID, Bacteria CITROBACTER FREUNDII (A)  Final   Organism ID, Bacteria ENTEROBACTER CLOACAE (A)  Final   Organism ID, Bacteria ENTEROCOCCUS FAECALIS (A)  Final      Susceptibility   Citrobacter freundii - MIC*    CEFEPIME 0.5 SENSITIVE Sensitive     CEFTRIAXONE >=64 RESISTANT Resistant     CIPROFLOXACIN <=0.06 SENSITIVE Sensitive     GENTAMICIN <=1 SENSITIVE Sensitive     NITROFURANTOIN <=16 SENSITIVE Sensitive     TRIMETH/SULFA <=20 SENSITIVE Sensitive     PIP/TAZO Value in next row Intermediate      64 INTERMEDIATEThis is a modified FDA-approved test that has been validated and its performance characteristics determined by the reporting laboratory.  This laboratory is certified under the Clinical Laboratory Improvement Amendments CLIA as qualified to perform high complexity clinical laboratory testing.    MEROPENEM Value in next row Sensitive      64 INTERMEDIATEThis is a modified FDA-approved test that has been validated and its performance characteristics determined by the reporting laboratory.  This laboratory is certified under the Clinical Laboratory Improvement Amendments CLIA as qualified to perform  high complexity clinical laboratory testing.    * >=100,000 COLONIES/mL CITROBACTER FREUNDII   Enterobacter cloacae - MIC*    CEFEPIME Value in next row Sensitive      64 INTERMEDIATEThis is a modified FDA-approved test that has been validated and its performance characteristics determined by the reporting laboratory.  This laboratory is certified under the Clinical Laboratory Improvement Amendments CLIA as qualified to perform high complexity clinical laboratory  testing.    ERTAPENEM Value in next row Intermediate      64 INTERMEDIATEThis is a modified FDA-approved test that has been validated and its performance characteristics determined by the reporting laboratory.  This laboratory is certified under the Clinical Laboratory Improvement Amendments CLIA as qualified to perform high complexity clinical laboratory testing.    CIPROFLOXACIN Value in next row Sensitive      64 INTERMEDIATEThis is a modified FDA-approved test that has been validated and its performance characteristics determined by the reporting laboratory.  This laboratory is certified under the Clinical Laboratory Improvement Amendments CLIA as qualified to perform high complexity clinical laboratory testing.    GENTAMICIN Value in next row Sensitive      64 INTERMEDIATEThis is a modified FDA-approved test that has been validated and its performance characteristics determined by the reporting laboratory.  This laboratory is certified under the Clinical Laboratory Improvement Amendments CLIA as qualified to perform high complexity clinical laboratory testing.    NITROFURANTOIN Value in next row Sensitive      64 INTERMEDIATEThis is a modified FDA-approved test that has been validated and its performance characteristics determined by the reporting laboratory.  This laboratory is certified under the Clinical Laboratory Improvement Amendments CLIA as qualified to perform high complexity clinical laboratory testing.    TRIMETH/SULFA Value in next row Sensitive      64 INTERMEDIATEThis is a modified FDA-approved test that has been validated and its performance characteristics determined by the reporting laboratory.  This laboratory is certified under the Clinical Laboratory Improvement Amendments CLIA as qualified to perform high complexity clinical laboratory testing.    PIP/TAZO Value in next row Intermediate      64 INTERMEDIATEThis is a modified FDA-approved test that has been validated and its  performance characteristics determined by the reporting laboratory.  This laboratory is certified under the Clinical Laboratory Improvement Amendments CLIA as qualified to perform high complexity clinical laboratory testing.    MEROPENEM Value in next row Sensitive      64 INTERMEDIATEThis is a modified FDA-approved test that has been validated and its performance characteristics determined by the reporting laboratory.  This laboratory is certified under the Clinical Laboratory Improvement Amendments CLIA as qualified to perform high complexity clinical laboratory testing.    * 40,000 COLONIES/mL ENTEROBACTER CLOACAE   Enterococcus faecalis - MIC*    AMPICILLIN Value in next row Sensitive      64 INTERMEDIATEThis is a modified FDA-approved test that has been validated and its performance characteristics determined by the reporting laboratory.  This laboratory is certified under the Clinical Laboratory Improvement Amendments CLIA as qualified to perform high complexity clinical laboratory testing.    NITROFURANTOIN Value in next row Sensitive      64 INTERMEDIATEThis is a modified FDA-approved test that has been validated and its performance characteristics determined by the reporting laboratory.  This laboratory is certified under the Clinical Laboratory Improvement Amendments CLIA as qualified to perform high complexity clinical laboratory testing.    VANCOMYCIN Value in next row Sensitive      64  INTERMEDIATEThis is a modified FDA-approved test that has been validated and its performance characteristics determined by the reporting laboratory.  This laboratory is certified under the Clinical Laboratory Improvement Amendments CLIA as qualified to perform high complexity clinical laboratory testing.    * 20,000 COLONIES/mL ENTEROCOCCUS FAECALIS    [x]  Treated with cephalexin, organism resistant to prescribed antimicrobial []  Patient discharged originally without antimicrobial agent and treatment is now  indicated  New antibiotic prescription: ciprofloxacin 500mg  PO daily x 5 days and amoxicillin 500mg  PO BID x  7 days  ED Provider: Jerilynn Later, MD   Chania Kochanski, Vernell Helling 02/20/2024, 9:41 AM Clinical Pharmacist Monday - Friday phone -  [phone] Saturday - Sunday phone - [phone]

## 2024-02-21 ENCOUNTER — Other Ambulatory Visit: Payer: Self-pay

## 2024-02-21 LAB — CULTURE, BLOOD (ROUTINE X 2)
Culture: NO GROWTH
Special Requests: ADEQUATE

## 2024-02-22 ENCOUNTER — Other Ambulatory Visit: Payer: Self-pay

## 2024-02-22 ENCOUNTER — Other Ambulatory Visit (HOSPITAL_COMMUNITY): Payer: Self-pay

## 2024-02-22 DIAGNOSIS — D6182 Myelophthisis: Secondary | ICD-10-CM

## 2024-02-22 DIAGNOSIS — N179 Acute kidney failure, unspecified: Secondary | ICD-10-CM

## 2024-02-23 ENCOUNTER — Other Ambulatory Visit (HOSPITAL_COMMUNITY): Payer: Self-pay

## 2024-02-25 ENCOUNTER — Telehealth: Payer: Self-pay

## 2024-02-25 ENCOUNTER — Inpatient Hospital Stay: Payer: Self-pay

## 2024-02-25 ENCOUNTER — Other Ambulatory Visit (HOSPITAL_COMMUNITY): Payer: Self-pay

## 2024-02-25 VITALS — BP 116/82 | HR 118 | Temp 97.2°F | Resp 17 | Wt 137.0 lb

## 2024-02-25 DIAGNOSIS — C61 Malignant neoplasm of prostate: Secondary | ICD-10-CM

## 2024-02-25 DIAGNOSIS — R634 Abnormal weight loss: Secondary | ICD-10-CM

## 2024-02-25 DIAGNOSIS — N179 Acute kidney failure, unspecified: Secondary | ICD-10-CM

## 2024-02-25 DIAGNOSIS — D6182 Myelophthisis: Secondary | ICD-10-CM

## 2024-02-25 DIAGNOSIS — D508 Other iron deficiency anemias: Secondary | ICD-10-CM

## 2024-02-25 DIAGNOSIS — D509 Iron deficiency anemia, unspecified: Secondary | ICD-10-CM | POA: Insufficient documentation

## 2024-02-25 LAB — CBC WITH DIFFERENTIAL (CANCER CENTER ONLY)
Abs Immature Granulocytes: 0.02 K/uL (ref 0.00–0.07)
Basophils Absolute: 0 K/uL (ref 0.0–0.1)
Basophils Relative: 1 %
Eosinophils Absolute: 0.1 K/uL (ref 0.0–0.5)
Eosinophils Relative: 1 %
HCT: 30.9 % — ABNORMAL LOW (ref 39.0–52.0)
Hemoglobin: 9.9 g/dL — ABNORMAL LOW (ref 13.0–17.0)
Immature Granulocytes: 0 %
Lymphocytes Relative: 9 %
Lymphs Abs: 0.6 K/uL — ABNORMAL LOW (ref 0.7–4.0)
MCH: 27.9 pg (ref 26.0–34.0)
MCHC: 32 g/dL (ref 30.0–36.0)
MCV: 87 fL (ref 80.0–100.0)
Monocytes Absolute: 0.2 K/uL (ref 0.1–1.0)
Monocytes Relative: 2 %
Neutro Abs: 6.2 K/uL (ref 1.7–7.7)
Neutrophils Relative %: 87 %
Platelet Count: 487 K/uL — ABNORMAL HIGH (ref 150–400)
RBC: 3.55 MIL/uL — ABNORMAL LOW (ref 4.22–5.81)
RDW: 18.3 % — ABNORMAL HIGH (ref 11.5–15.5)
WBC Count: 7.1 K/uL (ref 4.0–10.5)
nRBC: 0 % (ref 0.0–0.2)

## 2024-02-25 LAB — BASIC METABOLIC PANEL - CANCER CENTER ONLY
Anion gap: 12 (ref 5–15)
BUN: 73 mg/dL — ABNORMAL HIGH (ref 8–23)
CO2: 22 mmol/L (ref 22–32)
Calcium: 9.3 mg/dL (ref 8.9–10.3)
Chloride: 98 mmol/L (ref 98–111)
Creatinine: 3.22 mg/dL — ABNORMAL HIGH (ref 0.61–1.24)
GFR, Estimated: 21 mL/min — ABNORMAL LOW (ref >60)
Glucose, Bld: 242 mg/dL — ABNORMAL HIGH (ref 70–99)
Potassium: 4.4 mmol/L (ref 3.5–5.1)
Sodium: 132 mmol/L — ABNORMAL LOW (ref 135–145)

## 2024-02-25 LAB — IRON AND IRON BINDING CAPACITY (CC-WL,HP ONLY)
Iron: 34 ug/dL — ABNORMAL LOW (ref 45–182)
Saturation Ratios: 11 % — ABNORMAL LOW (ref 17.9–39.5)
TIBC: 301 ug/dL (ref 250–450)
UIBC: 267 ug/dL

## 2024-02-25 NOTE — Assessment & Plan Note (Addendum)
 Post C3 of docetaxel , will hold  Continue Orgovyx  and darolutamide   Follow up on 12/19 at 8am lab and see me [phone]

## 2024-02-25 NOTE — Telephone Encounter (Signed)
 I spoke with patient and explained what palliative care is, etc and he does not want to proceed with scheduling at this time.

## 2024-02-25 NOTE — Assessment & Plan Note (Addendum)
 Bilateral nephrostomy tube in place.  Recently capped and worsening renal function.  Urgently sent to IR for help  Continue 64 ounce of fluid daily Has follow-up with IR at [facility] for any new nephrostomy tube issues

## 2024-02-25 NOTE — Assessment & Plan Note (Addendum)
 Start gentle iron nightly

## 2024-02-25 NOTE — Progress Notes (Signed)
 [facility] OFFICE PROGRESS NOTE  Patient Care Team: Patient, No Pcp Per as PCP - General (General Practice) Vertell Pont, RN as Oncology Nurse Navigator  Brandon [name] is a 63 y.o.male with history of IDDM2, hyperlipidemia, [facility] for prostate cancer.  Staging showed multifocal skeletal metastases.    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 PSA  107 Germline testing: Referral placed Somatic testing: to be obtain Treatment: 12/13/23 started docetaxel  triple therapy   Patient unfortunately not feel well after his nephrostomy tube was capped.  He is not feeling better.  Treatment was held last time.  Renal function was worsening.   Repeat kidney function showed  Has it down to 0.06.  Will continue oral ADT and darolutamide .  Discussed we will obtain PSMA PET. Assessment & Plan Malignant neoplasm of prostate (HCC) Post C3 of docetaxel , will hold  Continue Orgovyx  and darolutamide   Follow up on 12/19 at 8am lab and see me [phone]  AKI (acute kidney injury) Bilateral nephrostomy tube in place.  Recently capped and worsening renal function.  Urgently sent to IR for help  Continue 64 ounce of fluid daily Has follow-up with IR at [facility] for any new nephrostomy tube issues Other iron deficiency anemia Start gentle iron nightly Weight loss Increase protein intake. Hold chemotherapy for now  No orders of the defined types were placed in this encounter.    Brandon [name] [place], MD  INTERVAL HISTORY: Patient returns for follow-up.  Oncology History  Malignant neoplasm of prostate (HCC)  10/16/2023 Imaging   CT urogram 1.  Moderate to severe bilateral hydroureteronephrosis, favored due to a locally invasive prostate neoplasm as discussed below. Recommend prompt evaluation of renal dysfunction and consideration for percutaneous decompression. Additionally, recommend correlation with PSA and urologic  evaluation.  2.  Markedly irregular prostate with enhancing soft tissue infiltrating the posterior bladder wall, seminal vesicles, and extends along the mesorectal fascia. Nodal disease extends to the external iliac chain nodes, however many of the common iliac and para-aortic nodes appear suspicious.  3.  Delayed bilateral nephrograms with no contrast excretion on delayed phase in the left kidney, indicative of compromised renal function.  4.  Sclerotic osseous metastatic disease.    10/30/2023 Initial Diagnosis   Malignant neoplasm of prostate (HCC)   11/01/2023 Cancer Staging   Staging form: Prostate, AJCC 8th Edition - Clinical stage from 11/01/2023: Stage IVB (cT2c, cN0, pM1b, PSA: 107, Grade Group: 4) - Signed by Sherwood Rise, PA-C on 12/04/2023 Histopathologic type: Adenocarcinoma, NOS Stage prefix: Initial diagnosis Prostate specific antigen (PSA) range: 20 or greater Gleason primary pattern: 4 Gleason secondary pattern: 4 Gleason score: 8 Histologic grading system: 5 grade system Number of biopsy cores examined: 10 Number of biopsy cores positive: 10 Location of positive needle core biopsies: Both sides   11/01/2023 Pathology Results   Biopsy from [facility]. A. PROSTATE, LEFT APEX, CORE BIOPSY:  Prostatic adenocarcinoma, Grade group 4 (Gleason score 4+4=8), involving approximately 10% of the prostatic tissue. Intraductal carcinoma present.               B. PROSTATE, LEFT MID, CORE BIOPSY:               Prostatic adenocarcinoma, Grade group 4 (Gleason score 4+4=8),  involving approximately 25% of the prostatic tissue; Cribriform glands present. Atypical intraductal proliferation.  C. PROSTATE, LEFT BASE, CORE BIOPSY:               Prostatic adenocarcinoma, Grade group 4 (Gleason score 4+4=8),  involving 2 of 2 cores and 60-70% of the prostatic tissue; Cribriform glands present. Intraductal carcinoma present.              Perineural invasion identified.   D.  PROSTATE, RIGHT APEX, CORE BIOPSY:               Prostatic adenocarcinoma, Grade group 3 (Gleason score 4+3=7), involving 2 of 2 cores and 15% of the prostatic tissue.   E. PROSTATE, RIGHT MID, CORE BIOPSY:               Prostatic adenocarcinoma, Grade group 3 (Gleason grade 4+3=7),  involving 2 of 2 cores and 10-15% of the prostatic tissue.              Intraductal carcinoma present. Perineural invasion identified.   F. PROSTATE, RIGHT BASE, CORE BIOPSY:                Prostatic adenocarcinoma, Grade group 3 (Gleason score 4+3=7), involving 2 of 2 cores and 20-25% of the prostatic tissue.              Intraductal carcinoma present.              Perineural invasion identified.   11/15/2023 PET scan   PSMA  PET 1. Multifocal intense radiotracer avid skeletal metastasis within the axillary skeleton.  2. No evidence of nodal metastasis. Enlarged LEFT external iliac node does not have significant radiotracer activity  3. No evidence of visceral metastasis.  4. Mild nonfocal radiotracer activity in the prostate gland.  5. Bilateral pleural effusions.    12/13/2023 -  Chemotherapy   Patient is on Treatment Plan : PROSTATE Docetaxel  (75) + Prednisone  q21d        PHYSICAL EXAMINATION: ECOG PERFORMANCE STATUS: 1  Vitals:   02/25/24 1547  BP: 116/82  Pulse: (!) 118  Resp: 17  Temp: (!) 97.2 F (36.2 C)  SpO2: 100%   Filed Weights   02/25/24 1547  Weight: 137 lb (62.1 kg)    GENERAL: alert, no distress and comfortable LUNGS: clear to auscultation and no wheeze or rales with normal breathing effort HEART: regular rate & rhythm  ABDOMEN: abdomen soft, non-tender and nondistended. Bilateral nephrostomy tubes with clear urine. Musculoskeletal: no edema   Relevant data reviewed during this visit included labs.  New labs ordered.

## 2024-02-25 NOTE — Assessment & Plan Note (Addendum)
 Increase protein intake. Hold chemotherapy for now

## 2024-02-26 ENCOUNTER — Emergency Department (HOSPITAL_BASED_OUTPATIENT_CLINIC_OR_DEPARTMENT_OTHER): Payer: Self-pay

## 2024-02-26 ENCOUNTER — Other Ambulatory Visit: Payer: Self-pay

## 2024-02-26 ENCOUNTER — Encounter (HOSPITAL_BASED_OUTPATIENT_CLINIC_OR_DEPARTMENT_OTHER): Payer: Self-pay | Admitting: Emergency Medicine

## 2024-02-26 ENCOUNTER — Emergency Department (HOSPITAL_BASED_OUTPATIENT_CLINIC_OR_DEPARTMENT_OTHER)
Admission: EM | Admit: 2024-02-26 | Discharge: 2024-02-26 | Disposition: A | Discharge status: Home / Self Care | Payer: Self-pay | Attending: Emergency Medicine | Admitting: Emergency Medicine

## 2024-02-26 ENCOUNTER — Telehealth: Payer: Self-pay

## 2024-02-26 DIAGNOSIS — Z794 Long term (current) use of insulin: Secondary | ICD-10-CM | POA: Insufficient documentation

## 2024-02-26 DIAGNOSIS — K5900 Constipation, unspecified: Secondary | ICD-10-CM | POA: Insufficient documentation

## 2024-02-26 DIAGNOSIS — E119 Type 2 diabetes mellitus without complications: Secondary | ICD-10-CM | POA: Insufficient documentation

## 2024-02-26 DIAGNOSIS — Z7984 Long term (current) use of oral hypoglycemic drugs: Secondary | ICD-10-CM | POA: Insufficient documentation

## 2024-02-26 LAB — CBC WITH DIFFERENTIAL/PLATELET
Abs Immature Granulocytes: 0.03 K/uL (ref 0.00–0.07)
Basophils Absolute: 0.1 K/uL (ref 0.0–0.1)
Basophils Relative: 1 %
Eosinophils Absolute: 0.2 K/uL (ref 0.0–0.5)
Eosinophils Relative: 2 %
HCT: 28.5 % — ABNORMAL LOW (ref 39.0–52.0)
Hemoglobin: 9.1 g/dL — ABNORMAL LOW (ref 13.0–17.0)
Immature Granulocytes: 0 %
Lymphocytes Relative: 14 %
Lymphs Abs: 1.1 K/uL (ref 0.7–4.0)
MCH: 28.1 pg (ref 26.0–34.0)
MCHC: 31.9 g/dL (ref 30.0–36.0)
MCV: 88 fL (ref 80.0–100.0)
Monocytes Absolute: 0.5 K/uL (ref 0.1–1.0)
Monocytes Relative: 6 %
Neutro Abs: 6.1 K/uL (ref 1.7–7.7)
Neutrophils Relative %: 77 %
Platelets: 451 K/uL — ABNORMAL HIGH (ref 150–400)
RBC: 3.24 MIL/uL — ABNORMAL LOW (ref 4.22–5.81)
RDW: 18.3 % — ABNORMAL HIGH (ref 11.5–15.5)
WBC: 8 K/uL (ref 4.0–10.5)
nRBC: 0 % (ref 0.0–0.2)

## 2024-02-26 LAB — BASIC METABOLIC PANEL WITH GFR
Anion gap: 14 (ref 5–15)
BUN: 74 mg/dL — ABNORMAL HIGH (ref 8–23)
CO2: 22 mmol/L (ref 22–32)
Calcium: 9.1 mg/dL (ref 8.9–10.3)
Chloride: 98 mmol/L (ref 98–111)
Creatinine, Ser: 3.44 mg/dL — ABNORMAL HIGH (ref 0.61–1.24)
GFR, Estimated: 19 mL/min — ABNORMAL LOW (ref >60)
Glucose, Bld: 281 mg/dL — ABNORMAL HIGH (ref 70–99)
Potassium: 4 mmol/L (ref 3.5–5.1)
Sodium: 134 mmol/L — ABNORMAL LOW (ref 135–145)

## 2024-02-26 MED ORDER — MAGNESIUM CITRATE PO SOLN: 1.0000 | Freq: Once | ORAL | Status: DC

## 2024-02-26 MED ORDER — FLEET ENEMA RE ENEM
1.0000 | ENEMA | Freq: Once | RECTAL | Status: AC
  Administered 2024-02-26: 1 via RECTAL
  Filled 2024-02-26: qty 1

## 2024-02-26 MED ORDER — FLEET ENEMA 7-19 GM/197ML RE ENEM
1.0000 | ENEMA | Freq: Every day | RECTAL | 0 refills | Status: DC | PRN
  Filled 2024-02-26: qty 230, 7d supply, fill #0

## 2024-02-26 MED ORDER — MAGNESIUM CITRATE PO SOLN
1.0000 | Freq: Once | ORAL | 1 refills | Status: AC
  Filled 2024-02-26: qty 195, fill #0

## 2024-02-26 NOTE — Discharge Instructions (Signed)
 You were seen in the Emergency Department for constipation The x-ray did not show any evidence of blockage You were able to have a bowel movement after a Fleet enema We have called in a prescription for Fleet enema for you to use at home as discussed We have also called in a prescription for magnesium  citrate to help with constipation Start with taking MiraLAX  and then add magnesium  citrate Drink half the bottle of magnesium  citrate and wait 30 to 60 minutes If you do not have a bowel movement at that time drink the other half of the bottle If you are still not having a bowel movement after MiraLAX  magnesium  citrate use a Fleet enema at home Ideally you achieve 1-2 soft bowel movements daily Do not take too much laxative so that you are having too much watery diarrhea as this will lead to dehydration For severe constipation return to the emergency department Otherwise follow-up with your oncology and primary team

## 2024-02-26 NOTE — Telephone Encounter (Signed)
 Pt contacted by scheduling team for initial palliative appt. Pt declined to schedule at this time. Services available upon re-referral.

## 2024-02-26 NOTE — ED Provider Notes (Signed)
 [place] EMERGENCY DEPARTMENT AT [facility] Provider Note   CSN: [id number] Arrival date & time: 02/26/24  1919     Patient presents with: Constipation   Brandon Aldava is a 63 y.o. male.  With a history of prostate cancer on chemotherapy and radiation and type 2 diabetes who presents to the ED for constipation.  Patient recently seen in this emergency department 10 days ago for constipation.  Relief after enema and disimpaction.  States he has not had a bowel movement despite MiraLAX  at home since that time.  Some abdominal discomfort no nausea vomiting fevers or chills.    Constipation      Prior to Admission medications   Medication Sig Start Date End Date Taking? Authorizing Provider  magnesium  citrate SOLN Take 296 mLs (1 Bottle total) by mouth once for 1 dose. 02/26/24 02/26/24 Yes Pamella Ozell LABOR, DO  Sodium Phosphates  (FLEET ENEMA) 7-19 GM/197ML ENEM Place 1 each rectally daily as needed for up to 7 days. 02/26/24 03/04/24 Yes Pamella Ozell LABOR, DO  amoxicillin  (AMOXIL ) 500 MG capsule Take 1 capsule (500 mg total) by mouth 2 (two) times daily for 7 days. 02/20/24 02/27/24  Rogelia Jerilynn RAMAN, MD  atorvastatin  (LIPITOR) 20 MG tablet Take 20 mg by mouth at bedtime. 11/27/23 11/26/24  [provider]  Blood Glucose Monitoring Suppl (ACCU-CHEK GUIDE) w/Device KIT 4 (four) times daily. 09/03/23   [provider]  Blood Glucose Monitoring Suppl (EMBRACE BLOOD GLUCOSE MONITOR) DEVI Check blood sugar before meals and at bedtime. 11/22/23     cephALEXin  (KEFLEX ) 500 MG capsule Take 1 capsule (500 mg total) by mouth 3 (three) times daily. 02/16/24   Dean Clarity, MD  Continuous Glucose Receiver (DEXCOM G7 RECEIVER) DEVI Use to check blood sugars at meals as directed. 01/25/24     Continuous Glucose Sensor (DEXCOM G7 SENSOR) MISC Use as directed to check blood sugars as needed as directed. 01/25/24   Murleen Fine, MD  darolutamide  (NUBEQA ) 300 MG tablet  Take 1 tablet (300 mg total) by mouth 2 (two) times daily with a meal. 12/25/23   Tina Pauletta BROCKS, MD  dexamethasone  (DECADRON ) 4 MG tablet Take 2 tabs by mouth 2 times daily starting day before chemo. Then take 2 tabs daily for 2 days starting day after chemo. Take with food. 02/08/24   Tina Pauletta BROCKS, MD  FORA Lancets MISC Lancets, ACHS (before meals and at bedtime), ICD-10 Code: Type 2 - E11.9 11/22/23   [provider]  gabapentin (NEURONTIN) 100 MG capsule Take 100 mg by mouth 2 (two) times daily. 11/24/23   [provider]  glucose blood test strip Check blood sugar before meals and at bedtime. 11/22/23     HYDROcodone -acetaminophen  (NORCO/VICODIN) 5-325 MG tablet Take 1 tablet by mouth every 6 (six) hours as needed for moderate pain (pain score 4-6). 02/18/24   Tina Pauletta BROCKS, MD  insulin  glargine (LANTUS ) 100 UNIT/ML Solostar Pen Inject 20 Units into the skin every evening. 01/21/24   Murleen Fine, MD  Insulin  Isophane & Regular Human (NOVOLIN  70/30 FLEXPEN RELION) (70-30) 100 UNIT/ML PEN Inject 20 Units into the skin 2 (two) times daily with a meal. 01/14/19   Shamleffer, Donell Cardinal, MD  insulin  lispro (HUMALOG ) 100 UNIT/ML KwikPen Inject into the skin. 11/22/23 11/21/24  [provider]  insulin  lispro (HUMALOG ) 100 UNIT/ML KwikPen Inject into the skin as directed 4 times daily with meals and at bedtime per sliding scale 01/30/24   Murleen Fine, MD  Insulin  Pen Needle (RELION PEN NEEDLES) 32G X 4 MM MISC 1 Device by Does not apply route 2 (two) times daily. 01/14/19   Shamleffer, Ibtehal Jaralla, MD  leuprolide, 6 Month, (LUPRON) 45 MG injection Inject 45 mg into the muscle every 6 (six) months. 11/07/23   [provider]  Multiple Vitamins-Minerals (MULTIVITAMIN WITH MINERALS) tablet Take 1 tablet by mouth daily.    [provider]  ondansetron  (ZOFRAN ) 8 MG tablet Take 1 tablet (8 mg total) by mouth every 8 (eight) hours as needed for  nausea or vomiting. 02/08/24   Tina Pauletta BROCKS, MD  polyethylene glycol powder (MIRALAX ) 17 GM/SCOOP powder Dissolve 1 capful (17g) in 4-8 ounces of liquid and take by mouth daily. 11/22/23     polyethylene glycol powder (MIRALAX ) 17 GM/SCOOP powder Take 17 g by mouth daily as needed for moderate constipation. Dissolve 1 capful (17g) in 4-8 ounces of liquid and take by mouth daily. 02/16/24   Dean Clarity, MD  PRECISION QID TEST test strip Test Strips - Appropriate for meter, ACHS (before meals and at bedtime), ICD-10 Code: Type 2 - E11.9 11/22/23   [provider]  predniSONE  (DELTASONE ) 5 MG tablet Take 1 tablet (5 mg total) by mouth in the morning and at bedtime. 12/06/23   Tina Pauletta BROCKS, MD  prochlorperazine  (COMPAZINE ) 10 MG tablet Take 1 tablet (10 mg total) by mouth every 6 (six) hours as needed for nausea or vomiting. 01/31/24   Tina Pauletta BROCKS, MD  relugolix  (ORGOVYX ) 120 MG tablet Take 3 tablets (360 mg total) by mouth on day 1, then take 1 tablet (120 mg total) daily thereafter. ICD 10 code: C61 01/31/24   Tina Pauletta BROCKS, MD  senna (SENOKOT) 8.6 MG tablet Take 2 tablets (17.2 mg total) by mouth at bedtime. 11/22/23     tamsulosin (FLOMAX) 0.4 MG CAPS capsule Take 0.4 mg by mouth daily.    [provider]    Allergies: Patient has no known allergies.    Review of Systems  Gastrointestinal:  Positive for constipation.    Updated Vital Signs BP (!) 143/75   Pulse 97   Temp 98.8 F (37.1 C) (Oral)   Resp 15   SpO2 100%   Physical Exam Vitals and nursing note reviewed.  HENT:     Head: Normocephalic and atraumatic.  Eyes:     Pupils: Pupils are equal, round, and reactive to light.  Cardiovascular:     Rate and Rhythm: Normal rate and regular rhythm.  Pulmonary:     Effort: Pulmonary effort is normal.     Breath sounds: Normal breath sounds.  Abdominal:     Tenderness: There is abdominal tenderness.     Comments: Firm abdomen with mild diffuse tenderness  without rebound rigidity or guarding  Skin:    General: Skin is warm and dry.  Neurological:     Mental Status: He is alert.  Psychiatric:        Mood and Affect: Mood normal.     (all labs ordered are listed, but only abnormal results are displayed) Labs Reviewed  BASIC METABOLIC PANEL WITH GFR - Abnormal; Notable for the following components:      Result Value   Sodium 134 (*)    Glucose, Bld 281 (*)    BUN 74 (*)    Creatinine, Ser 3.44 (*)    GFR, Estimated 19 (*)    All other components within normal limits  CBC WITH DIFFERENTIAL/PLATELET - Abnormal; Notable  for the following components:   RBC 3.24 (*)    Hemoglobin 9.1 (*)    HCT 28.5 (*)    RDW 18.3 (*)    Platelets 451 (*)    All other components within normal limits    EKG: None  Radiology: DG Abdomen 1 View Result Date: 02/26/2024 EXAM: 1 VIEW XRAY OF THE ABDOMEN 02/26/2024 10:22:00 PM COMPARISON: CT abdomen and pelvis 02/16/2024. CLINICAL HISTORY: const FINDINGS: LINES, TUBES AND DEVICES: Bilateral percutaneous ureteronephrostomy tubes appear unchanged in position since prior study. BOWEL: Gas in stool throughout the colon with moderate colonic stool burden. No small or large bowel distention. Nonobstructive bowel gas pattern. SOFT TISSUES: No radiopaque stones are demonstrated. BONES: Heterogeneous sclerosis of the skeleton corresponds with known metastatic disease. LUNG BASES: Lung bases are clear. IMPRESSION: 1. No bowel obstruction. 2. Bilateral percutaneous ureteronephrostomy tubes unchanged in position since prior study. 3. Heterogeneous skeletal sclerosis consistent with known metastatic disease. Electronically signed by: Elsie Gravely MD 02/26/2024 10:27 PM EST RP Workstation: [id number]     Procedures   Medications Ordered in the ED  sodium phosphate  (FLEET) enema 1 enema (1 enema Rectal Given 02/26/24 2232)                                    Medical Decision Making 63 year old male with history  as above returns for constipation.  Has been using MiraLAX  at home with little effect.  Firm abdomen with mild tenderness no rebound rigidity or guarding.  Afebrile otherwise well-appearing.  He was able to have a bowel movement after administration of Fleet enema here.  Will discharge with magnesium  citrate and Fleet enema prescriptions.  Counseled patient and his daughter at bedside of bowel regimen at home.  They voiced articulation of return precautions to come back to the ED for if constipation is unresolved despite these interventions at home  Amount and/or Complexity of Data Reviewed Labs: ordered. Radiology: ordered.  Risk OTC drugs.        Final diagnoses:  Constipation, unspecified constipation type    ED Discharge Orders          Ordered    Sodium Phosphates  (FLEET ENEMA) 7-19 GM/197ML ENEM  Daily PRN        02/26/24 2312    magnesium  citrate SOLN   Once        02/26/24 2312               Pamella Ozell LABOR, DO 02/26/24 2319

## 2024-02-26 NOTE — ED Triage Notes (Signed)
 Seen on 02/16/24 for constipation. Has not had a BM since Some discomfort Denies nausea vomiting Currently on chemo and radiation for prostate CA

## 2024-02-27 ENCOUNTER — Other Ambulatory Visit: Payer: Self-pay

## 2024-02-27 ENCOUNTER — Other Ambulatory Visit (HOSPITAL_COMMUNITY): Payer: Self-pay

## 2024-02-27 ENCOUNTER — Telehealth (HOSPITAL_BASED_OUTPATIENT_CLINIC_OR_DEPARTMENT_OTHER): Payer: Self-pay | Admitting: Student

## 2024-02-27 MED ORDER — ATORVASTATIN CALCIUM 20 MG PO TABS
20.0000 mg | ORAL_TABLET | Freq: Every day | ORAL | 2 refills | Status: AC
  Filled 2024-02-27: qty 90, 90d supply, fill #0

## 2024-02-27 MED ORDER — FLEET ENEMA 7-19 GM/197ML RE ENEM
1.0000 | ENEMA | Freq: Every day | RECTAL | 0 refills | Status: AC | PRN
  Filled 2024-02-27: qty 230, 7d supply, fill #0

## 2024-02-27 NOTE — Telephone Encounter (Cosign Needed)
 Patient called [facility] emergency department requesting prescription to be sent to [facility]  Patient seen yesterday in the ED for constipation.  Was prescribed Magnesium  Citrate and Fleet Enemas. Prescription sent.

## 2024-03-05 ENCOUNTER — Other Ambulatory Visit (HOSPITAL_COMMUNITY): Payer: Self-pay | Admitting: Emergency Medicine

## 2024-03-05 ENCOUNTER — Other Ambulatory Visit (HOSPITAL_COMMUNITY): Payer: Self-pay

## 2024-03-08 ENCOUNTER — Other Ambulatory Visit (HOSPITAL_COMMUNITY): Payer: Self-pay

## 2024-03-08 ENCOUNTER — Other Ambulatory Visit (HOSPITAL_COMMUNITY): Payer: Self-pay | Admitting: Emergency Medicine

## 2024-03-10 ENCOUNTER — Other Ambulatory Visit: Payer: Self-pay

## 2024-03-20 NOTE — Assessment & Plan Note (Addendum)
 Disease status: Responding Evidence: Lab Results Post C3 of docetaxel , difficulty tolerating. PSA down to 0.06 Continue Orgovyx  and darolutamide   Follow up in at 8am lab and see me 830

## 2024-03-20 NOTE — Assessment & Plan Note (Addendum)
 Bilateral nephroureteral tubes in place Continue increase fluid, monitor urine output.  Avoid renal toxins

## 2024-03-20 NOTE — Assessment & Plan Note (Addendum)
 Overall improved. Profound bone mets from diagnosis Will repeat ferritin now cancer is better controlled.

## 2024-03-20 NOTE — Assessment & Plan Note (Addendum)
 calcium  (1000-1200 mg daily from food and supplements) and vitamin D3 (1000 IU daily) Healthy lifestyle to prevent diabetes and CV disease Aggressive cardiovascular risk management Exercise as able when disease is controlled Limit alcohol consumption and avoid smoking

## 2024-03-20 NOTE — Progress Notes (Unsigned)
 [name] OFFICE PROGRESS NOTE  Patient Care Team: Patient, No Pcp Per as PCP - General (General Practice) Vertell Pont, RN as Oncology Nurse Navigator   [name] is a 63 y.o.male with history of IDDM2, hyperlipidemia, being [facility] for prostate cancer.  Staging showed multifocal skeletal metastases.    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 PSA  107 Germline testing: Referral placed Somatic testing: to be obtain Treatment: 12/13/23 started docetaxel  triple therapy  Psa down to 0.06. goal of <0.2. clinically slowly improving. Will continue ADT/Darolutamide .  Follow up with IR as planned. Assessment & Plan Malignant neoplasm of prostate (HCC) Disease status: Responding Evidence: Lab Results Post C3 of docetaxel , difficulty tolerating. 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 CKD stage 3b, GFR 30-44 ml/min (HCC) Bilateral nephroureteral tubes in place Continue increase fluid, monitor urine output.  Avoid renal toxins At risk for adverse drug event calcium  (1000-1200 mg daily from food and supplements) and vitamin D3 (1000 IU daily) Healthy lifestyle to prevent diabetes and CV disease Aggressive cardiovascular risk management Exercise as able when disease is controlled Limit alcohol consumption and avoid smoking  Right thigh pain Repeat bone scan and obtain CT  Iron deficiency anemia due to chronic blood loss Will trial of oral iron dailiy  Orders Placed This Encounter  Procedures   NM Bone Scan Whole Body    Standing Status:   Future    Expected Date:   03/31/2024    Expiration Date:   03/21/2025    If indicated for the ordered procedure, I authorize the administration of a  radiopharmaceutical per Radiology protocol:   Yes    Preferred imaging location?:   [name]   CT EXTREMITY LOWER RIGHT WO CONTRAST    Standing Status:   Future    Expected Date:   03/31/2024    Expiration Date:   03/21/2025    If indicated for the ordered procedure, I authorize the administration of contrast media per Radiology protocol:   Yes    Preferred imaging location?:   [facility]   CBC with Differential ([name] Only)    Standing Status:   Future    Expiration Date:   03/21/2025   CMP (Can[name] only)    Standing Status:   Future    Expiration Date:   03/21/2025   Testosterone     Standing Status:   Future    Expiration Date:   03/21/2025   PSA    Standing Status:   Future    Expiration Date:   03/21/2025   Ferritin   Ambulatory referral to Social Work    Referral Priority:   Routine    Referral Type:   Consultation    Referral Reason:   Specialty Services Required    Number of Visits Requested:   1     [name], MD  INTERVAL HISTORY: Patient returns for follow-up. Report swelling on the stomach area. No pain, bloody stool, urine or n/v/d. No constipation. Report good urine output bilaterally and occasional through penis. No fever. Pain in the right thigh is persistent. Other areas resolved.  Oncology History  Malignant neoplasm of prostate (HCC)  10/16/2023 Imaging   CT urogram  1.  Moderate to severe bilateral hydroureteronephrosis, favored due to a locally invasive prostate neoplasm as discussed below. Recommend prompt evaluation of renal dysfunction and consideration for percutaneous decompression. Additionally, recommend correlation with PSA and urologic evaluation.  2.  Markedly irregular prostate with enhancing soft tissue infiltrating the posterior bladder wall, seminal vesicles, and extends along the mesorectal fascia. Nodal disease extends to the external iliac chain nodes, however many of the common iliac and para-aortic  nodes appear suspicious.  3.  Delayed bilateral nephrograms with no contrast excretion on delayed phase in the left kidney, indicative of compromised renal function.  4.  Sclerotic osseous metastatic disease.    10/30/2023 Initial Diagnosis   Malignant neoplasm of prostate (HCC)   11/01/2023 Cancer Staging   Staging form: Prostate, AJCC 8th Edition - Clinical stage from 11/01/2023: Stage IVB (cT2c, cN0, pM1b, PSA: 107, Grade Group: 4) - Signed by Sherwood Rise, PA-C on 12/04/2023 Histopathologic type: Adenocarcinoma, NOS Stage prefix: Initial diagnosis Prostate specific antigen (PSA) range: 20 or greater Gleason primary pattern: 4 Gleason secondary pattern: 4 Gleason score: 8 Histologic grading system: 5 grade system Number of biopsy cores examined: 10 Number of biopsy cores positive: 10 Location of positive needle core biopsies: Both sides   11/01/2023 Pathology Results   Biopsy from [facility]. A. PROSTATE, LEFT APEX, CORE BIOPSY:  Prostatic adenocarcinoma, Grade group 4 (Gleason score 4+4=8), involving approximately 10% of the prostatic tissue. Intraductal carcinoma present.               B. PROSTATE, LEFT MID, CORE BIOPSY:               Prostatic adenocarcinoma, Grade group 4 (Gleason score 4+4=8),  involving approximately 25% of the prostatic tissue; Cribriform glands present. Atypical intraductal proliferation.               C. PROSTATE, LEFT BASE, CORE BIOPSY:               Prostatic adenocarcinoma, Grade group 4 (Gleason score 4+4=8),  involving 2 of 2 cores and 60-70% of the prostatic tissue; Cribriform glands present. Intraductal carcinoma present.              Perineural invasion identified.   D. PROSTATE, RIGHT APEX, CORE BIOPSY:               Prostatic adenocarcinoma, Grade group 3 (Gleason score 4+3=7), involving 2 of 2 cores and 15% of the prostatic tissue.   E. PROSTATE, RIGHT MID, CORE BIOPSY:               Prostatic adenocarcinoma, Grade group 3 (Gleason grade  4+3=7),  involving 2 of 2 cores and 10-15% of the prostatic tissue.              Intraductal carcinoma present. Perineural invasion identified.   F. PROSTATE, RIGHT BASE, CORE BIOPSY:                Prostatic adenocarcinoma, Grade group 3 (Gleason score 4+3=7), involving 2 of 2 cores and 20-25% of the prostatic tissue.              Intraductal carcinoma present.              Perineural invasion identified.   11/15/2023 PET scan   PSMA  PET 1. Multifocal intense radiotracer avid skeletal metastasis within the axillary skeleton.  2. No evidence of nodal metastasis. Enlarged LEFT external iliac node does not have significant radiotracer activity  3. No evidence of  visceral metastasis.  4. Mild nonfocal radiotracer activity in the prostate gland.  5. Bilateral pleural effusions.    12/13/2023 - 01/26/2024 Chemotherapy   Patient is on Treatment Plan : PROSTATE Docetaxel  (75) + Prednisone  q21d        PHYSICAL EXAMINATION: ECOG PERFORMANCE STATUS: 2  Vitals:   03/21/24 0850  BP: 123/81  Pulse: (!) 107  Resp: 20  Temp: 97.7 F (36.5 C)  SpO2: 100%   Filed Weights   03/21/24 0850  Weight: 148 lb 9.6 oz (67.4 kg)    GENERAL: alert, no distress and comfortable LUNGS: clear to auscultation and no wheeze or rales with normal breathing effort HEART: regular rate & rhythm  ABDOMEN: abdomen soft, non-tender and nondistended. Bilateral nephro tubes Musculoskeletal: no edema  Relevant data reviewed during this visit included labs.  New labs ordered.

## 2024-03-21 ENCOUNTER — Other Ambulatory Visit: Payer: Self-pay

## 2024-03-21 ENCOUNTER — Inpatient Hospital Stay: Payer: Self-pay

## 2024-03-21 ENCOUNTER — Other Ambulatory Visit (HOSPITAL_COMMUNITY): Payer: Self-pay

## 2024-03-21 ENCOUNTER — Ambulatory Visit: Payer: Self-pay

## 2024-03-21 ENCOUNTER — Telehealth: Payer: Self-pay

## 2024-03-21 VITALS — BP 123/81 | HR 107 | Temp 97.7°F | Resp 20 | Wt 148.6 lb

## 2024-03-21 DIAGNOSIS — M79651 Pain in right thigh: Secondary | ICD-10-CM

## 2024-03-21 DIAGNOSIS — D508 Other iron deficiency anemias: Secondary | ICD-10-CM

## 2024-03-21 DIAGNOSIS — N1832 Chronic kidney disease, stage 3b: Secondary | ICD-10-CM

## 2024-03-21 DIAGNOSIS — Z9189 Other specified personal risk factors, not elsewhere classified: Secondary | ICD-10-CM

## 2024-03-21 DIAGNOSIS — C61 Malignant neoplasm of prostate: Secondary | ICD-10-CM

## 2024-03-21 DIAGNOSIS — D6182 Myelophthisis: Secondary | ICD-10-CM

## 2024-03-21 DIAGNOSIS — C7951 Secondary malignant neoplasm of bone: Secondary | ICD-10-CM | POA: Insufficient documentation

## 2024-03-21 DIAGNOSIS — D500 Iron deficiency anemia secondary to blood loss (chronic): Secondary | ICD-10-CM | POA: Insufficient documentation

## 2024-03-21 DIAGNOSIS — D509 Iron deficiency anemia, unspecified: Secondary | ICD-10-CM | POA: Insufficient documentation

## 2024-03-21 DIAGNOSIS — N1330 Unspecified hydronephrosis: Secondary | ICD-10-CM | POA: Insufficient documentation

## 2024-03-21 DIAGNOSIS — Z79899 Other long term (current) drug therapy: Secondary | ICD-10-CM | POA: Insufficient documentation

## 2024-03-21 LAB — CMP (CANCER CENTER ONLY)
ALT: 26 U/L (ref 0–44)
AST: 17 U/L (ref 15–41)
Albumin: 4 g/dL (ref 3.5–5.0)
Alkaline Phosphatase: 47 U/L (ref 38–126)
Anion gap: 13 (ref 5–15)
BUN: 64 mg/dL — ABNORMAL HIGH (ref 8–23)
CO2: 23 mmol/L (ref 22–32)
Calcium: 9.4 mg/dL (ref 8.9–10.3)
Chloride: 103 mmol/L (ref 98–111)
Creatinine: 3.65 mg/dL — ABNORMAL HIGH (ref 0.61–1.24)
GFR, Estimated: 18 mL/min — ABNORMAL LOW
Glucose, Bld: 272 mg/dL — ABNORMAL HIGH (ref 70–99)
Potassium: 5.1 mmol/L (ref 3.5–5.1)
Sodium: 139 mmol/L (ref 135–145)
Total Bilirubin: 0.3 mg/dL (ref 0.0–1.2)
Total Protein: 7.3 g/dL (ref 6.5–8.1)

## 2024-03-21 LAB — CBC WITH DIFFERENTIAL (CANCER CENTER ONLY)
Abs Immature Granulocytes: 0.02 K/uL (ref 0.00–0.07)
Basophils Absolute: 0 K/uL (ref 0.0–0.1)
Basophils Relative: 1 %
Eosinophils Absolute: 0.1 K/uL (ref 0.0–0.5)
Eosinophils Relative: 2 %
HCT: 29.8 % — ABNORMAL LOW (ref 39.0–52.0)
Hemoglobin: 9.4 g/dL — ABNORMAL LOW (ref 13.0–17.0)
Immature Granulocytes: 1 %
Lymphocytes Relative: 25 %
Lymphs Abs: 1 K/uL (ref 0.7–4.0)
MCH: 28.1 pg (ref 26.0–34.0)
MCHC: 31.5 g/dL (ref 30.0–36.0)
MCV: 89 fL (ref 80.0–100.0)
Monocytes Absolute: 0.3 K/uL (ref 0.1–1.0)
Monocytes Relative: 6 %
Neutro Abs: 2.6 K/uL (ref 1.7–7.7)
Neutrophils Relative %: 65 %
Platelet Count: 321 K/uL (ref 150–400)
RBC: 3.35 MIL/uL — ABNORMAL LOW (ref 4.22–5.81)
RDW: 15.3 % (ref 11.5–15.5)
WBC Count: 4 K/uL (ref 4.0–10.5)
nRBC: 0 % (ref 0.0–0.2)

## 2024-03-21 LAB — IRON AND IRON BINDING CAPACITY (CC-WL,HP ONLY)
Iron: 34 ug/dL — ABNORMAL LOW (ref 45–182)
Saturation Ratios: 12 % — ABNORMAL LOW (ref 17.9–39.5)
TIBC: 287 ug/dL (ref 250–450)
UIBC: 253 ug/dL

## 2024-03-21 LAB — FERRITIN: Ferritin: 432 ng/mL — ABNORMAL HIGH (ref 24–336)

## 2024-03-21 LAB — PSA: Prostatic Specific Antigen: <0.02 ng/mL (ref 0.00–4.00)

## 2024-03-21 MED ORDER — FERROUS SULFATE 325 (65 FE) MG PO TBEC
325.0000 mg | DELAYED_RELEASE_TABLET | Freq: Every day | ORAL | 2 refills | Status: AC
  Filled 2024-03-21: qty 100, 100d supply, fill #0

## 2024-03-21 NOTE — Assessment & Plan Note (Signed)
 Will trial of oral iron dailiy

## 2024-03-21 NOTE — Telephone Encounter (Signed)
 Explained to the wife about starting oral iron to increase his iron levels. We discussed why we did not order IV iron.

## 2024-03-21 NOTE — Telephone Encounter (Signed)
 [facility] Clinical Social Work  Clinical Social Work was referred by engineer, civil (consulting) for financial concerns Scientist, Water Quality).  Clinical Social Worker contacted patient by phone to offer support and assess for needs.  Patient confirmed need for assistance due to him being self pay.  Interventions: CSW attempted to receive status on Medicaid Application, patient referred CSW to his spouse. Email sent. , awaiting response CSW informed patient if medicaid is denied, enrolling in health insurance during open enrollment is the next option.     Lizbeth Sprague, LCSW  Clinical Social Worker [facility]

## 2024-03-22 LAB — TESTOSTERONE: Testosterone: <3 ng/dL — ABNORMAL LOW (ref 264–916)

## 2024-03-25 ENCOUNTER — Other Ambulatory Visit (HOSPITAL_COMMUNITY): Payer: Self-pay | Admitting: Pharmacist

## 2024-03-25 NOTE — Progress Notes (Signed)
 Spoke with patient about Monofer[employer] patient assistance. There would still be an administration fee which is about $150. Patient states that he would like to try the oral iron therapy for now  D/w Dr. Tina in secure chat and he is aware.  Sherry Pennant, PharmD, MPH, BCPS, CPP Clinical Pharmacist

## 2024-03-31 ENCOUNTER — Other Ambulatory Visit: Payer: Self-pay | Admitting: Internal Medicine

## 2024-03-31 ENCOUNTER — Encounter (HOSPITAL_COMMUNITY)
Admission: RE | Admit: 2024-03-31 | Discharge: 2024-03-31 | Disposition: A | Discharge status: Home / Self Care | Payer: Self-pay | Source: Ambulatory Visit

## 2024-03-31 DIAGNOSIS — M79651 Pain in right thigh: Secondary | ICD-10-CM

## 2024-03-31 DIAGNOSIS — C61 Malignant neoplasm of prostate: Secondary | ICD-10-CM | POA: Insufficient documentation

## 2024-03-31 MED ORDER — TECHNETIUM TC 99M MEDRONATE IV KIT
20.0000 | PACK | Freq: Once | INTRAVENOUS | Status: AC | PRN
  Administered 2024-03-31: 20.6 via INTRAVENOUS

## 2024-04-04 ENCOUNTER — Inpatient Hospital Stay: Payer: Self-pay

## 2024-04-10 ENCOUNTER — Inpatient Hospital Stay: Payer: Self-pay

## 2024-04-10 DIAGNOSIS — Z9189 Other specified personal risk factors, not elsewhere classified: Secondary | ICD-10-CM

## 2024-04-10 DIAGNOSIS — C61 Malignant neoplasm of prostate: Secondary | ICD-10-CM

## 2024-04-10 DIAGNOSIS — N1832 Chronic kidney disease, stage 3b: Secondary | ICD-10-CM

## 2024-04-10 NOTE — Assessment & Plan Note (Addendum)
 calcium  (1000-1200 mg daily from food and supplements) and vitamin D3 (1000 IU daily) Healthy lifestyle to prevent diabetes and CV disease Aggressive cardiovascular risk management Exercise as able when disease is controlled Limit alcohol consumption and avoid smoking

## 2024-04-10 NOTE — Assessment & Plan Note (Addendum)
 Disease status: Complete PSA response Evidence: Lab Results Post C3 of docetaxel , difficulty tolerating. PSA now < 0.02 Continue Orgovyx  and darolutamide 

## 2024-04-10 NOTE — Assessment & Plan Note (Addendum)
 Bilateral nephroureteral tubes in place Continue increase fluid, monitor urine output.  Avoid renal toxins Follow-up with nephrostomy tubes exchange.

## 2024-04-10 NOTE — Progress Notes (Signed)
 [facility] OFFICE PROGRESS NOTE  Patient Care Team: Murleen Fine, MD as PCP - General (Family Medicine) Vertell Pont, RN as Oncology Nurse Navigator  Telephone visit requested per patient. Physician location: [place] [facility] Patient location: Home in [place] [facility] Total time for the encounter: 19 minutes  Brandon [name] is a 64 y.o.male with history of IDDM2, hyperlipidemia, prostate cancer being follow-up.  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 PSA  107 Germline testing: Referral placed Somatic testing: to be obtain Treatment: 12/13/23 started docetaxel  triple therapy  PSA is down to less than 0.02.  Pleat 3 cycle of docetaxel .  Due to worsening fatigue, not feeling well in general, chemotherapy discontinued.  Will continue ADT and darolutamide .  CT for right femur pain did not show acute fractures.  Multifocal osseous sclerotic metastases.  Also osteoarthritis.  He has started oral iron and doing well.  Will continue. Assessment & Plan Malignant neoplasm of prostate (HCC) Disease status: Complete PSA response Evidence: Lab Results Post C3 of docetaxel , difficulty tolerating. PSA now < 0.02 Continue Orgovyx  and darolutamide   At risk for adverse drug event calcium  (1000-1200 mg daily from food and supplements) and vitamin D3 (1000 IU daily) Healthy lifestyle to prevent diabetes and CV disease Aggressive cardiovascular risk management Exercise as able when disease is controlled Limit alcohol consumption and avoid smoking  CKD stage 3b, GFR 30-44 ml/min (HCC) Bilateral nephroureteral tubes in place Continue increase fluid, monitor urine output.  Avoid renal toxins Follow-up with nephrostomy tubes exchange.  Return in mid February with lab a day before next visit.  Labs on 2/16 and see me on 2/17.   Brandon JAYSON Chihuahua, MD  INTERVAL HISTORY: Patient being follow-up to go over results.   Overall he is report feeling better.  He denies much pain.  He has no nausea.  Report urine is clear.  Report he has good urine output and whenever he drinks comes up.  Oncology History  Malignant neoplasm of prostate (HCC)  10/16/2023 Imaging   CT urogram 1.  Moderate to severe bilateral hydroureteronephrosis, favored due to a locally invasive prostate neoplasm as discussed below. Recommend prompt evaluation of renal dysfunction and consideration for percutaneous decompression. Additionally, recommend correlation with PSA and urologic evaluation.  2.  Markedly irregular prostate with enhancing soft tissue infiltrating the posterior bladder wall, seminal vesicles, and extends along the mesorectal fascia. Nodal disease extends to the external iliac chain nodes, however many of the common iliac and para-aortic nodes appear suspicious.  3.  Delayed bilateral nephrograms with no contrast excretion on delayed phase in the left kidney, indicative of compromised renal function.  4.  Sclerotic osseous metastatic disease.    10/30/2023 Initial Diagnosis   Malignant neoplasm of prostate (HCC)   11/01/2023 Cancer Staging   Staging form: Prostate, AJCC 8th Edition - Clinical stage from 11/01/2023: Stage IVB (cT2c, cN0, pM1b, PSA: 107, Grade Group: 4) - Signed by Sherwood Rise, PA-C on 12/04/2023 Histopathologic type: Adenocarcinoma, NOS Stage prefix: Initial diagnosis Prostate specific antigen (PSA) range: 20 or greater Gleason primary pattern: 4 Gleason secondary pattern: 4 Gleason score: 8 Histologic grading system: 5 grade system Number of biopsy cores examined: 10 Number of biopsy cores positive: 10 Location of positive needle core biopsies: Both sides   11/01/2023 Pathology Results   Biopsy from [facility]. A. PROSTATE, LEFT APEX, CORE BIOPSY:  Prostatic adenocarcinoma, Grade group 4 (Gleason score 4+4=8), involving approximately  10% of the prostatic tissue. Intraductal carcinoma present.                B. PROSTATE, LEFT MID, CORE BIOPSY:               Prostatic adenocarcinoma, Grade group 4 (Gleason score 4+4=8),  involving approximately 25% of the prostatic tissue; Cribriform glands present. Atypical intraductal proliferation.               C. PROSTATE, LEFT BASE, CORE BIOPSY:               Prostatic adenocarcinoma, Grade group 4 (Gleason score 4+4=8),  involving 2 of 2 cores and 60-70% of the prostatic tissue; Cribriform glands present. Intraductal carcinoma present.              Perineural invasion identified.   D. PROSTATE, RIGHT APEX, CORE BIOPSY:               Prostatic adenocarcinoma, Grade group 3 (Gleason score 4+3=7), involving 2 of 2 cores and 15% of the prostatic tissue.   E. PROSTATE, RIGHT MID, CORE BIOPSY:               Prostatic adenocarcinoma, Grade group 3 (Gleason grade 4+3=7),  involving 2 of 2 cores and 10-15% of the prostatic tissue.              Intraductal carcinoma present. Perineural invasion identified.   F. PROSTATE, RIGHT BASE, CORE BIOPSY:                Prostatic adenocarcinoma, Grade group 3 (Gleason score 4+3=7), involving 2 of 2 cores and 20-25% of the prostatic tissue.              Intraductal carcinoma present.              Perineural invasion identified.   11/15/2023 PET scan   PSMA  PET 1. Multifocal intense radiotracer avid skeletal metastasis within the axillary skeleton.  2. No evidence of nodal metastasis. Enlarged LEFT external iliac node does not have significant radiotracer activity  3. No evidence of visceral metastasis.  4. Mild nonfocal radiotracer activity in the prostate gland.  5. Bilateral pleural effusions.    12/13/2023 - 01/26/2024 Chemotherapy   Patient is on Treatment Plan : PROSTATE Docetaxel  (75) + Prednisone  q21d       Relevant data reviewed during this visit included labs and imaging.

## 2024-04-11 ENCOUNTER — Telehealth: Payer: Self-pay | Admitting: *Deleted

## 2024-04-11 ENCOUNTER — Other Ambulatory Visit: Payer: Self-pay

## 2024-04-11 DIAGNOSIS — N1832 Chronic kidney disease, stage 3b: Secondary | ICD-10-CM

## 2024-04-11 NOTE — Progress Notes (Signed)
 Please fax referral to nephrology for CKD. Thank you

## 2024-04-11 NOTE — Telephone Encounter (Signed)
 Referral faxed to Dr Gearline at [facility]

## 2024-04-17 ENCOUNTER — Telehealth: Payer: Self-pay | Admitting: *Deleted

## 2024-04-17 NOTE — Telephone Encounter (Signed)
 LM with Dr Fredrik note below.  Yes there is no fractures.  It shows the known cancer that spread to the bone in the past.  Continue his medication, vitamin D, calcium , it does take time to heal.

## 2024-04-17 NOTE — Telephone Encounter (Signed)
 Wife called to see about the results of most recent scan. States Mr Brandon [name] told her it was OK. Wants to confirm.[id number]

## 2024-05-02 ENCOUNTER — Inpatient Hospital Stay: Payer: Self-pay

## 2024-05-19 ENCOUNTER — Inpatient Hospital Stay: Payer: Self-pay

## 2024-05-20 ENCOUNTER — Inpatient Hospital Stay: Payer: Self-pay
